# Patient Record
Sex: Female | Born: 2000 | Race: White | Hispanic: No | Marital: Single | Smoking: Never smoker
Health system: Southern US, Community
[De-identification: ages and names within clinical notes are randomized; demographics above are authoritative.]

## PROBLEM LIST (undated history)

## (undated) ENCOUNTER — Inpatient Hospital Stay (HOSPITAL_COMMUNITY): Payer: Self-pay

## (undated) DIAGNOSIS — N921 Excessive and frequent menstruation with irregular cycle: Secondary | ICD-10-CM

## (undated) DIAGNOSIS — N946 Dysmenorrhea, unspecified: Secondary | ICD-10-CM

## (undated) DIAGNOSIS — E041 Nontoxic single thyroid nodule: Secondary | ICD-10-CM

## (undated) DIAGNOSIS — Z7689 Persons encountering health services in other specified circumstances: Secondary | ICD-10-CM

## (undated) DIAGNOSIS — F419 Anxiety disorder, unspecified: Secondary | ICD-10-CM

## (undated) DIAGNOSIS — N898 Other specified noninflammatory disorders of vagina: Secondary | ICD-10-CM

## (undated) DIAGNOSIS — N63 Unspecified lump in unspecified breast: Secondary | ICD-10-CM

## (undated) DIAGNOSIS — F329 Major depressive disorder, single episode, unspecified: Secondary | ICD-10-CM

## (undated) DIAGNOSIS — N83201 Unspecified ovarian cyst, right side: Secondary | ICD-10-CM

## (undated) DIAGNOSIS — N83209 Unspecified ovarian cyst, unspecified side: Secondary | ICD-10-CM

## (undated) DIAGNOSIS — F32A Depression, unspecified: Secondary | ICD-10-CM

## (undated) HISTORY — DX: Nontoxic single thyroid nodule: E04.1

## (undated) HISTORY — DX: Other specified noninflammatory disorders of vagina: N89.8

## (undated) HISTORY — DX: Depression, unspecified: F32.A

## (undated) HISTORY — DX: Dysmenorrhea, unspecified: N94.6

## (undated) HISTORY — DX: Unspecified ovarian cyst, right side: N83.201

## (undated) HISTORY — DX: Excessive and frequent menstruation with irregular cycle: N92.1

## (undated) HISTORY — DX: Anxiety disorder, unspecified: F41.9

## (undated) HISTORY — DX: Unspecified lump in unspecified breast: N63.0

## (undated) HISTORY — DX: Unspecified ovarian cyst, unspecified side: N83.209

## (undated) HISTORY — DX: Persons encountering health services in other specified circumstances: Z76.89

---
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
## 2020-01-07 ENCOUNTER — Other Ambulatory Visit: Payer: Self-pay

## 2020-01-07 ENCOUNTER — Encounter: Payer: Self-pay | Admitting: Family Medicine

## 2020-01-07 ENCOUNTER — Ambulatory Visit (INDEPENDENT_AMBULATORY_CARE_PROVIDER_SITE_OTHER): Payer: Medicaid Other | Admitting: Family Medicine

## 2020-01-07 VITALS — BP 112/72 | Temp 98.0°F | Ht 66.0 in | Wt 149.6 lb

## 2020-01-07 DIAGNOSIS — T24201A Burn of second degree of unspecified site of right lower limb, except ankle and foot, initial encounter: Secondary | ICD-10-CM

## 2020-01-07 DIAGNOSIS — L03115 Cellulitis of right lower limb: Secondary | ICD-10-CM

## 2020-01-07 MED ORDER — MUPIROCIN 2 % EX OINT: TOPICAL_OINTMENT | CUTANEOUS | 0 refills | Status: DC

## 2020-01-07 MED ORDER — CEPHALEXIN 500 MG PO CAPS: 500.0000 mg | ORAL_CAPSULE | Freq: Three times a day (TID) | ORAL | 0 refills | Status: DC

## 2020-01-07 NOTE — Patient Instructions (Signed)
  This is some additional information regarding Covid vaccine.  The Covid vaccine-specifically Pfizer and Moderna-help program the body to help recognize Covid to lessen the chance of getting sick with Covid plus also dramatically lessen the risk of a severe outcome.  These vaccines require 2 shots spaced several weeks apart.  The vaccine utilizes mRNA technology and does not interact with our DNA.  The research for these vaccines started back in the 1990s with mRNA research and technology.  The use of mRNA technology for vaccines was actually developed back during SARS infection of [place] in 2005.  Because this virus did not go worldwide there was never a need to utilize a vaccine.  The vaccine simply instructs our immune system how to recognize Covid virus thus lessening our risk of severe illness.  The studies show that vaccines are generally very well-tolerated.  It is common to have some mild side effects such as soreness at the site of the injection, for some individuals low-grade fever body aches headaches nausea diarrhea not feeling good for 2 to 3 days after the injection.  These moderate side effects are more likely to occur with the second shot.  Within 2 weeks of completing the second shot antibody levels are at a good level. Study showed that since June 2020 90% of those hospitalized for Covid were unvaccinated.  96% of those who died of Covid were unvaccinated.  The risk of dying from Covid for those who are vaccinated and under age 50 approaches 0%.  Most people who get sick with Covid will gradually get better but there is a randomness to the illness in which even younger individuals can have a severe case and end up in the hospital with Covid.  It is true that the older we are, Covid poses more risk.  Unfortunately some of the individuals who have severe cases or die from Covid have very little underlying health issues and are not considered "elderly ".1 in 7 individuals who have  Covid continue to have some symptoms of long-term Covid.  Sometimes this can be altered smell.  There are some this can result with long-term fatigue tiredness or headaches.  Also it has been shown.  Scientific studies that Covid can trigger heart attacks and strokes.  Having a Covid vaccine will not protect one against all Covid infections.  Approximately 20 to 30% of individuals who have had vaccines may get a amount of breakthrough case.  The way I look upon this-most of us drive cars that have seatbelts and in some cases airbags.  Every day we drive, we take a risk of potentially being in the traffic accident.  Having a seatbelt and airbag does not mean that we will not get in a traffic accident.  But having a seatbelt and the airbag can dramatically lower our risk of a severe outcome that could put us in the hospital or cause us to die from a auto accident.  In the same way having the Covid vaccine will lessen our risk of getting sick with Covid but if we are unlucky enough to get Covid the chance of being hospitalized or dying from it is tremendously lower.  So just as I would not feel good about disengaging my seatbelt and disabling my airbag-I would not feel good about living in a pandemic and not having the vaccine.  It is not unusual for viruses to mutate and therefore it is not unusual to have to sometimes redesign vaccines or get   a booster in order to maintain effectiveness of the vaccine.  I have had several individuals who have stated that they are not around others and so therefore they do not need to get the vaccine.  The difficult part is-this virus can infect some without triggering symptoms-therefore we can get exposed by being around a person who looks healthy-yet they are spreading the virus.  Every person is at risk of getting Covid.  I certainly recognize that many patients have difficulty deciding whether or not to do the vaccine.  Unfortunately there is a lot of information being  pushed forward on the news media, social media such as Facebook, and discussion of all friends and family that can be confusing for the average person to figure out.  It is important to realize that this information that we are exposed to falls into 3 categories. Category #1-on target information-this is typically information that is shown by scientific studies, or stated forth by experts within their fields. Category #2-misinformation-this is information that is well intended but is not an accurate reflection of what is really the case. Category #3-dis-information-this is information that is purposefully off base.  It is designed to confuse and discourage an individual from making a proper decision.  Through the past year and a half I have heard many different reasons why people do not get Covid vaccines.  Some of these reasons are a reflection of a person's deeply held beliefs.  Other reasons are based upon misinformation propagated in social media.  When making this decision it is extremely important to weed through all the noise!   Through my years of training, it has helped me to look at public health issues and treatment options from a medical perspective.  It is my firm belief that this vaccine is safe (I personally favor Moderna but Pfizer vaccine is also good).  Please do not underestimate this virus.  It is my sincere wish that all of our patients stay healthy and do not suffer tragic outcomes from this virus.  Please consider getting the vaccine if you have not already done so.  If you should have further questions please let us know.  Thanks-Dr. Olander Friedl  
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
## 2020-01-08 ENCOUNTER — Ambulatory Visit: Payer: Self-pay | Admitting: Family Medicine

## 2020-01-29 ENCOUNTER — Encounter: Payer: Self-pay | Admitting: Family Medicine

## 2020-01-29 ENCOUNTER — Other Ambulatory Visit: Payer: Self-pay

## 2020-01-29 ENCOUNTER — Ambulatory Visit (INDEPENDENT_AMBULATORY_CARE_PROVIDER_SITE_OTHER): Payer: Medicaid Other | Admitting: Family Medicine

## 2020-01-29 VITALS — BP 122/70 | HR 98 | Temp 97.8°F | Ht 64.5 in | Wt 152.8 lb

## 2020-01-29 DIAGNOSIS — Z0000 Encounter for general adult medical examination without abnormal findings: Secondary | ICD-10-CM

## 2020-01-29 DIAGNOSIS — Z111 Encounter for screening for respiratory tuberculosis: Secondary | ICD-10-CM | POA: Diagnosis not present

## 2020-01-29 NOTE — Progress Notes (Signed)
The patient comes in today for a wellness visit.    A review of their health history was completed.  A review of medications was also completed.  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   Additional concerns: none    Patient ID: Tammy Barnett, female    DOB: 06/29/2000, 19 y.o.   MRN: [id number]   Chief Complaint  Patient presents with  . Annual Exam   Subjective:  CC: physical exam  Presents today for annual physical exam.  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  Paperwork brought with her.  She sees GYN for her oral contraceptives, does not meet criteria/guidelines for Pap smear yet.  Has no concerns for her health.  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  She has no chronic conditions managed by this office.    Medical History Tammy Barnett has a past medical history of Anxiety, Breast lump (09/10/2015), Depression, Dysmenorrhea (01/08/2014), Encounter for menstrual regulation (01/15/2014), Irregular intermenstrual bleeding (08/27/2014), Right ovarian cyst (01/15/2014), Ruptured ovarian cyst (01/08/2014), Thyroid nodule (09/10/2015), and Vaginal odor (04/16/2014).   Outpatient Encounter Medications as of 01/29/2020  Medication Sig  . acyclovir (ZOVIRAX) 400 MG tablet TAKE 1 TABLET BY MOUTH TWICE DAILY FOR PREVENTION  . Norethin Ace-Eth Estrad-FE 1-20 MG-MCG(24) CHEW CHEW AND SWALLOW 1 TABLET BY MOUTH ONCE DAILY  . [DISCONTINUED] cephALEXin (KEFLEX) 500 MG capsule Take 1 capsule (500 mg total) by mouth 3 (three) times daily.  . [DISCONTINUED] fluconazole (DIFLUCAN) 150 MG tablet Take 1 now and 1 in 3 days (Patient not taking: Reported on 01/07/2020)  . [DISCONTINUED] metroNIDAZOLE (FLAGYL) 500 MG tablet Take  1 tablet (500 mg total) by mouth 2 (two) times daily. (Patient not taking: Reported on 01/07/2020)  . [DISCONTINUED] mupirocin ointment (BACTROBAN) 2 % aply twice daily to blister   No facility-administered encounter medications on file as of 01/29/2020.     Review of Systems  Constitutional: Negative for chills and fever.  HENT: Negative for ear pain.   Respiratory: Negative for shortness of breath.   Cardiovascular: Negative for chest pain.  Gastrointestinal: Negative for abdominal pain.     Vitals BP 122/70   Pulse 98   Temp 97.8 F (36.6 C)   Ht 5' 4.5" (1.638 m)   Wt 152 lb 12.8 oz (69.3 kg)   SpO2 97%   BMI 25.82 kg/m   Objective:   Physical Exam Vitals reviewed.  Constitutional:      General: She is not in acute distress.    Appearance: Normal appearance.  HENT:     Right Ear: Tympanic membrane normal.     Left Ear: Tympanic membrane normal.     Mouth/Throat:     Mouth: Mucous membranes are moist.     Pharynx: No oropharyngeal exudate.  Eyes:     Extraocular Movements: Extraocular movements intact.     Pupils: Pupils are equal, round, and reactive to light.  Cardiovascular:     Rate and Rhythm: Normal rate and regular rhythm.     Heart sounds: Normal heart sounds.  Pulmonary:     Effort: Pulmonary effort is normal.     Breath sounds: Normal breath sounds.  Abdominal:  General: Bowel sounds are normal.     Tenderness: There is no abdominal tenderness.  Musculoskeletal:        General: Normal range of motion.  Skin:    General: Skin is warm and dry.  Neurological:     General: No focal deficit present.     Mental Status: She is alert.  Psychiatric:        Behavior: Behavior normal.      Assessment and Plan   1. Screening for tuberculosis - QuantiFERON-TB Gold Plus  2. Wellness examination   Healthy and normal exam today.  She will get the QuantiFERON-TB Gold plus to meet the work requirement for her TB skin test.  Required paperwork will  be completed once resulted.  No medication refills required, gets her oral contraceptives from GYN.  Agrees with plan of care discussed today. Understands warning signs to seek further care: Chest pain, shortness of breath, any significant change in health. Understands to follow-up in 1 year for annual exam, sooner if anything changes, or if needed.  We will notify her once TB results are available, and she can pick up her paperwork at the front office.    Chalmers Guest, NP 01/29/2020

## 2020-01-29 NOTE — Patient Instructions (Signed)
Preventive Care 21-19 Years Old, Female Preventive care refers to visits with your health care provider and lifestyle choices that can promote health and wellness. This includes:  A yearly physical exam. This may also be called an annual well check.  Regular dental visits and eye exams.  Immunizations.  Screening for certain conditions.  Healthy lifestyle choices, such as eating a healthy diet, getting regular exercise, not using drugs or products that contain nicotine and tobacco, and limiting alcohol use. What can I expect for my preventive care visit? Physical exam Your health care provider will check your:  Height and weight. This may be used to calculate body mass index (BMI), which tells if you are at a healthy weight.  Heart rate and blood pressure.  Skin for abnormal spots. Counseling Your health care provider may ask you questions about your:  Alcohol, tobacco, and drug use.  Emotional well-being.  Home and relationship well-being.  Sexual activity.  Eating habits.  Work and work environment.  Method of birth control.  Menstrual cycle.  Pregnancy history. What immunizations do I need?  Influenza (flu) vaccine  This is recommended every year. Tetanus, diphtheria, and pertussis (Tdap) vaccine  You may need a Td booster every 10 years. Varicella (chickenpox) vaccine  You may need this if you have not been vaccinated. Human papillomavirus (HPV) vaccine  If recommended by your health care provider, you may need three doses over 6 months. Measles, mumps, and rubella (MMR) vaccine  You may need at least one dose of MMR. You may also need a second dose. Meningococcal conjugate (MenACWY) vaccine  One dose is recommended if you are age 19-21 years and a first-year college student living in a residence hall, or if you have one of several medical conditions. You may also need additional booster doses. Pneumococcal conjugate (PCV13) vaccine  You may need  this if you have certain conditions and were not previously vaccinated. Pneumococcal polysaccharide (PPSV23) vaccine  You may need one or two doses if you smoke cigarettes or if you have certain conditions. Hepatitis A vaccine  You may need this if you have certain conditions or if you travel or work in places where you may be exposed to hepatitis A. Hepatitis B vaccine  You may need this if you have certain conditions or if you travel or work in places where you may be exposed to hepatitis B. Haemophilus influenzae type b (Hib) vaccine  You may need this if you have certain conditions. You may receive vaccines as individual doses or as more than one vaccine together in one shot (combination vaccines). Talk with your health care provider about the risks and benefits of combination vaccines. What tests do I need?  Blood tests  Lipid and cholesterol levels. These may be checked every 5 years starting at age 20.  Hepatitis C test.  Hepatitis B test. Screening  Diabetes screening. This is done by checking your blood sugar (glucose) after you have not eaten for a while (fasting).  Sexually transmitted disease (STD) testing.  BRCA-related cancer screening. This may be done if you have a family history of breast, ovarian, tubal, or peritoneal cancers.  Pelvic exam and Pap test. This may be done every 3 years starting at age 21. Starting at age 30, this may be done every 5 years if you have a Pap test in combination with an HPV test. Talk with your health care provider about your test results, treatment options, and if necessary, the need for more tests.   Follow these instructions at home: Eating and drinking   Eat a diet that includes fresh fruits and vegetables, whole grains, lean protein, and low-fat dairy.  Take vitamin and mineral supplements as recommended by your health care provider.  Do not drink alcohol if: ? Your health care provider tells you not to drink. ? You are  pregnant, may be pregnant, or are planning to become pregnant.  If you drink alcohol: ? Limit how much you have to 0-1 drink a day. ? Be aware of how much alcohol is in your drink. In the [place] one drink equals one 12 oz bottle of beer (355 mL), one 5 oz glass of wine (148 mL), or one 1 oz glass of hard liquor (44 mL). Lifestyle  Take daily care of your teeth and gums.  Stay active. Exercise for at least 30 minutes on 5 or more days each week.  Do not use any products that contain nicotine or tobacco, such as cigarettes, e-cigarettes, and chewing tobacco. If you need help quitting, ask your health care provider.  If you are sexually active, practice safe sex. Use a condom or other form of birth control (contraception) in order to prevent pregnancy and STIs (sexually transmitted infections). If you plan to become pregnant, see your health care provider for a preconception visit. What's next?  Visit your health care provider once a year for a well check visit.  Ask your health care provider how often you should have your eyes and teeth checked.  Stay up to date on all vaccines. This information is not intended to replace advice given to you by your health care provider. Make sure you discuss any questions you have with your health care provider. Document Revised: 10/05/2017 Document Reviewed: 10/05/2017 Elsevier Patient Education  2020 [employer].

## 2020-02-01 LAB — QUANTIFERON-TB GOLD PLUS
QuantiFERON Mitogen Value: >10 IU/mL
QuantiFERON Nil Value: 0.08 IU/mL
QuantiFERON TB1 Ag Value: 0.05 IU/mL
QuantiFERON TB2 Ag Value: 0.05 IU/mL
QuantiFERON-TB Gold Plus: NEGATIVE

## 2020-02-02 ENCOUNTER — Ambulatory Visit
Admission: EM | Admit: 2020-02-02 | Discharge: 2020-02-02 | Disposition: A | Discharge status: Home / Self Care | Payer: Medicaid Other | Attending: Family Medicine | Admitting: Family Medicine

## 2020-02-02 ENCOUNTER — Other Ambulatory Visit: Payer: Self-pay

## 2020-02-02 DIAGNOSIS — J069 Acute upper respiratory infection, unspecified: Secondary | ICD-10-CM

## 2020-02-02 DIAGNOSIS — R6889 Other general symptoms and signs: Secondary | ICD-10-CM | POA: Diagnosis not present

## 2020-02-02 DIAGNOSIS — R059 Cough, unspecified: Secondary | ICD-10-CM | POA: Diagnosis not present

## 2020-02-02 DIAGNOSIS — R000 Tachycardia, unspecified: Secondary | ICD-10-CM | POA: Diagnosis not present

## 2020-02-02 DIAGNOSIS — J029 Acute pharyngitis, unspecified: Secondary | ICD-10-CM | POA: Diagnosis not present

## 2020-02-02 LAB — POCT RAPID STREP A (OFFICE): Rapid Strep A Screen: NEGATIVE

## 2020-02-02 MED ORDER — DEXAMETHASONE SODIUM PHOSPHATE 10 MG/ML IJ SOLN
10.0000 mg | Freq: Once | INTRAMUSCULAR | Status: AC
  Administered 2020-02-02: 14:00:00 10 mg via INTRAMUSCULAR

## 2020-02-02 MED ORDER — AZITHROMYCIN 250 MG PO TABS: 250.0000 mg | ORAL_TABLET | Freq: Every day | ORAL | 0 refills | Status: DC

## 2020-02-02 NOTE — Discharge Instructions (Addendum)
I have sent in azithromycin for you to take. Take 2 tablets today, then one tablet daily for the next 4 days. As long as Covid and flu are negative.  Strep test was negative. We will culture this and let you know if you need further treatment  Your COVID and Flu tests are pending.  You should self quarantine until the test results are back.    Take Tylenol or ibuprofen as needed for fever or discomfort.  Rest and keep yourself hydrated.    Follow-up with your primary care provider if your symptoms are not improving.

## 2020-02-02 NOTE — ED Provider Notes (Signed)
[place]   [id number] 02/02/20 Arrival Time: [id number]   CC: COVID symptoms  SUBJECTIVE: History from: patient.  Tammy Barnett is a 19 y.o. female who presents with abrupt onset of nasal congestion,  And sore throat for the last 3 days. Denies sick exposure to COVID, flu or strep. Denies recent travel. Has negative history of Covid. Has not completed Covid vaccines. Has not taken OTC medications for this. Throat pain is worse when swallowing, tolerating secretions well. Denies previous symptoms in the past. Denies fever, chills, fatigue, sinus pain, rhinorrhea,  SOB, wheezing, chest pain, nausea, changes in bowel or bladder habits.    ROS: As per HPI.  All other pertinent ROS negative.     Past Medical History:  Diagnosis Date  . Anxiety   . Breast lump 09/10/2015  . Depression   . Dysmenorrhea 01/08/2014  . Encounter for menstrual regulation 01/15/2014  . Irregular intermenstrual bleeding 08/27/2014  . Right ovarian cyst 01/15/2014  . Ruptured ovarian cyst 01/08/2014  . Thyroid nodule 09/10/2015  . Vaginal odor 04/16/2014   History reviewed. No pertinent surgical history. No Known Allergies No current facility-administered medications on file prior to encounter.   Current Outpatient Medications on File Prior to Encounter  Medication Sig Dispense Refill  . acyclovir (ZOVIRAX) 400 MG tablet TAKE 1 TABLET BY MOUTH TWICE DAILY FOR PREVENTION 60 tablet 5  . Norethin Ace-Eth Estrad-FE 1-20 MG-MCG(24) CHEW CHEW AND SWALLOW 1 TABLET BY MOUTH ONCE DAILY 28 tablet 12   Social History   Socioeconomic History  . Marital status: Single    Spouse name: Not on file  . Number of children: Not on file  . Years of education: Not on file  . Highest education level: Not on file  Occupational History  . Not on file  Tobacco Use  . Smoking status: Never Smoker  . Smokeless tobacco: Never Used  Vaping Use  . Vaping Use: Never used  Substance and Sexual Activity  . Alcohol use: No   . Drug use: No  . Sexual activity: Yes    Birth control/protection: Pill  Other Topics Concern  . Not on file  Social History Narrative  . Not on file   Social Determinants of Health   Financial Resource Strain: Not on file  Food Insecurity: Not on file  Transportation Needs: Not on file  Physical Activity: Not on file  Stress: Not on file  Social Connections: Not on file  Intimate Partner Violence: Not on file   Family History  Problem Relation Age of Onset  . Hypertension Father   . Diabetes Paternal Grandmother   . Hypertension Paternal Grandfather     OBJECTIVE:  Vitals:   02/02/20 1310  BP: 129/78  Pulse: (!) 107  Resp: 18  Temp: 98.1 F (36.7 C)  TempSrc: Oral  SpO2: 99%     General appearance: alert; appears fatigued, but nontoxic; speaking in full sentences and tolerating own secretions HEENT: NCAT; Ears: EACs clear, TMs pearly gray; Eyes: PERRL.  EOM grossly intact. Sinuses: nontender; Nose: nares patent without rhinorrhea, Throat: oropharynx erythematous with petechiae, tonsils erythematous and 2+ enlarged, uvula midline  Neck: supple with LAD Lungs: unlabored respirations, symmetrical air entry; cough: absent; no respiratory distress; CTAB Heart: regular rate and rhythm.  Radial pulses 2+ symmetrical bilaterally Skin: warm and dry Psychological: alert and cooperative; normal mood and affect  LABS:  No results found for this or any previous visit (from the past 24 hour(s)).   ASSESSMENT &  PLAN:  1. Upper respiratory tract infection, unspecified type   2. Sore throat   3. Tachycardia   4. Cough     Meds ordered this encounter  Medications  . dexamethasone (DECADRON) injection 10 mg  . azithromycin (ZITHROMAX) 250 MG tablet    Sig: Take 1 tablet (250 mg total) by mouth daily. Take first 2 tablets together, then 1 every day until finished.    Dispense:  6 tablet    Refill:  0    Do not fill until 02/04/20    Order Specific Question:    Supervising Provider    Answer:   Chase Picket [[id number]]  . dexamethasone (DECADRON) injection 10 mg   Will treat for URI and tonsillitis Prescribed azithromycin Decadron 10mg  IM in office today Rapid strep is negative Will culture and inform of positive results that require further treatment  COVID and flu testing ordered.  It will take between 1-2 days for test results.  Someone will contact you regarding abnormal results.   Patient should remain in quarantine until they have received Covid results.  If negative you may resume normal activities (go back to work/school) while practicing hand hygiene, social distance, and mask wearing.  If positive, patient should remain in quarantine for 10 days from symptom onset AND greater than 72 hours after symptoms resolution (absence of fever without the use of fever-reducing medication and improvement in respiratory symptoms), whichever is longer Get plenty of rest and push fluids Use OTC zyrtec for nasal congestion, runny nose, and/or sore throat Use OTC flonase for nasal congestion and runny nose Use medications daily for symptom relief Use OTC medications like ibuprofen or tylenol as needed fever or pain Call or go to the ED if you have any new or worsening symptoms such as fever, worsening cough, shortness of breath, chest tightness, chest pain, turning blue, changes in mental status.  Reviewed expectations re: course of current medical issues. Questions answered. Outlined signs and symptoms indicating need for more acute intervention. Patient verbalized understanding. After Visit Summary given.         Faustino Congress, NP 02/04/20 [phone]

## 2020-02-02 NOTE — ED Triage Notes (Signed)
Pt presents with c/o sore throat for past few days also has nasal congestion

## 2020-02-04 LAB — COVID-19, FLU A+B NAA
Influenza A, NAA: NOT DETECTED
Influenza B, NAA: NOT DETECTED
SARS-CoV-2, NAA: NOT DETECTED

## 2020-02-04 LAB — CULTURE, GROUP A STREP (THRC)

## 2020-02-13 ENCOUNTER — Other Ambulatory Visit: Payer: Self-pay | Admitting: Adult Health

## 2020-04-13 ENCOUNTER — Other Ambulatory Visit: Payer: Self-pay

## 2020-04-13 ENCOUNTER — Encounter: Payer: Self-pay | Admitting: Emergency Medicine

## 2020-04-13 ENCOUNTER — Ambulatory Visit
Admission: EM | Admit: 2020-04-13 | Discharge: 2020-04-13 | Disposition: A | Discharge status: Home / Self Care | Payer: Medicaid Other | Attending: Emergency Medicine | Admitting: Emergency Medicine

## 2020-04-13 DIAGNOSIS — Z1152 Encounter for screening for COVID-19: Secondary | ICD-10-CM | POA: Diagnosis not present

## 2020-04-13 DIAGNOSIS — Z20822 Contact with and (suspected) exposure to covid-19: Secondary | ICD-10-CM

## 2020-04-13 DIAGNOSIS — J039 Acute tonsillitis, unspecified: Secondary | ICD-10-CM

## 2020-04-13 MED ORDER — LIDOCAINE VISCOUS HCL 2 % MT SOLN: 15.0000 mL | Freq: Four times a day (QID) | OROMUCOSAL | 0 refills | Status: DC | PRN

## 2020-04-13 MED ORDER — AMOXICILLIN-POT CLAVULANATE 875-125 MG PO TABS: 1.0000 | ORAL_TABLET | Freq: Two times a day (BID) | ORAL | 0 refills | Status: DC

## 2020-04-13 NOTE — Discharge Instructions (Addendum)
  Get plenty of rest and push fluids Augmentin was prescribed for  possible tonsillitis Viscous lidocaine prescribed.  This is an oral solution you can swish, and gargle as needed for symptomatic relief of sore throat.  Do not exceed 8 doses in a 24 hour period.  Do not use prior to eating, as this will numb your entire mouth.   Use throat lozenges such as Halls, Vicks or Cepacol to soothe throat Gargle with salty warm water daily Use medications daily for symptom relief Use OTC medications like ibuprofen or tylenol as needed fever or pain Call or go to the ED if you have any new or worsening symptoms such as fever, worsening cough, shortness of breath, chest tightness, chest pain, turning blue, changes in mental status, etc..Marland Kitchen

## 2020-04-13 NOTE — ED Triage Notes (Signed)
States she is aching all over

## 2020-04-13 NOTE — ED Provider Notes (Signed)
[place]   [id number] 04/13/20 Arrival Time: 0957   CC: COVID symptoms  SUBJECTIVE: History from: patient.  Tammy Barnett is a 20 y.o. female who presented to the urgent care for complaint of fever, sore throat that started yesterday.  Denies sick exposure to COVID, flu or strep.  Denies recent travel.  Has tried OTC NyQuil and Tylenol with mild relief.  Symptoms are made worse with swallowing.  Denies previous symptoms in the past.   Denies , fatigue, sinus pain, rhinorrhea,  SOB, wheezing, chest pain, nausea, changes in bowel or bladder habits.    ROS: As per HPI.  All other pertinent ROS negative.     Past Medical History:  Diagnosis Date  . Anxiety   . Breast lump 09/10/2015  . Depression   . Dysmenorrhea 01/08/2014  . Encounter for menstrual regulation 01/15/2014  . Irregular intermenstrual bleeding 08/27/2014  . Right ovarian cyst 01/15/2014  . Ruptured ovarian cyst 01/08/2014  . Thyroid nodule 09/10/2015  . Vaginal odor 04/16/2014   History reviewed. No pertinent surgical history. No Known Allergies No current facility-administered medications on file prior to encounter.   Current Outpatient Medications on File Prior to Encounter  Medication Sig Dispense Refill  . acyclovir (ZOVIRAX) 400 MG tablet TAKE 1 TABLET BY MOUTH TWICE DAILY FOR PREVENTION 60 tablet 5  . azithromycin (ZITHROMAX) 250 MG tablet Take 1 tablet (250 mg total) by mouth daily. Take first 2 tablets together, then 1 every day until finished. 6 tablet 0  . Norethin Ace-Eth Estrad-FE 1-20 MG-MCG(24) CHEW CHEW AND SWALLOW 1 TABLET BY MOUTH ONCE DAILY 28 tablet 12   Social History   Socioeconomic History  . Marital status: Single    Spouse name: Not on file  . Number of children: Not on file  . Years of education: Not on file  . Highest education level: Not on file  Occupational History  . Not on file  Tobacco Use  . Smoking status: Never Smoker  . Smokeless tobacco: Never Used  Vaping Use   . Vaping Use: Every day  Substance and Sexual Activity  . Alcohol use: No  . Drug use: No  . Sexual activity: Yes    Birth control/protection: Pill  Other Topics Concern  . Not on file  Social History Narrative  . Not on file   Social Determinants of Health   Financial Resource Strain: Not on file  Food Insecurity: Not on file  Transportation Needs: Not on file  Physical Activity: Not on file  Stress: Not on file  Social Connections: Not on file  Intimate Partner Violence: Not on file   Family History  Problem Relation Age of Onset  . Hypertension Father   . Diabetes Paternal Grandmother   . Hypertension Paternal Grandfather     OBJECTIVE:  Vitals:   04/13/20 1027  BP: 121/75  Pulse: 97  Resp: 18  Temp: 99.2 F (37.3 C)  TempSrc: Oral  SpO2: 95%     General appearance: alert; appears fatigued, but nontoxic; speaking in full sentences and tolerating own secretions HEENT: NCAT; Ears: EACs clear, TMs pearly gray; Eyes: PERRL.  EOM grossly intact. Sinuses: nontender; Nose: nares patent without rhinorrhea, Throat: oropharynx clear, tonsils  2+ erythematous , no exudate, uvula midline  Neck: supple without LAD Lungs: unlabored respirations, symmetrical air entry; cough: absent; no respiratory distress; CTAB Heart: regular rate and rhythm.  Radial pulses 2+ symmetrical bilaterally Skin: warm and dry Psychological: alert and cooperative; normal mood  and affect  LABS:  No results found for this or any previous visit (from the past 24 hour(s)).   ASSESSMENT & PLAN:  1. Acute tonsillitis, unspecified etiology   2. Exposure to COVID-19 virus   3. Encounter for screening for COVID-19     Meds ordered this encounter  Medications  . lidocaine (XYLOCAINE) 2 % solution    Sig: Use as directed 15 mLs in the mouth or throat every 6 (six) hours as needed for mouth pain.    Dispense:  100 mL    Refill:  0  . amoxicillin-clavulanate (AUGMENTIN) 875-125 MG tablet    Sig:  Take 1 tablet by mouth every 12 (twelve) hours.    Dispense:  14 tablet    Refill:  0    Discharge instructions  Get plenty of rest and push fluids Augmentin was prescribed for  possible tonsillitis Viscous lidocaine prescribed.  This is an oral solution you can swish, and gargle as needed for symptomatic relief of sore throat.  Do not exceed 8 doses in a 24 hour period.  Do not use prior to eating, as this will numb your entire mouth.   Use throat lozenges such as Halls, Vicks or Cepacol to soothe throat Gargle with salty warm water daily Use medications daily for symptom relief Use OTC medications like ibuprofen or tylenol as needed fever or pain Call or go to the ED if you have any new or worsening symptoms such as fever, worsening cough, shortness of breath, chest tightness, chest pain, turning blue, changes in mental status, etc...   Reviewed expectations re: course of current medical issues. Questions answered. Outlined signs and symptoms indicating need for more acute intervention. Patient verbalized understanding. After Visit Summary given.         Emerson Monte, FNP 04/13/20 1117

## 2020-04-13 NOTE — ED Triage Notes (Signed)
Sore throat since yesterday.  Throat started hurting after she used a diffiernt type of vape.

## 2020-04-14 ENCOUNTER — Telehealth: Payer: Self-pay | Admitting: Emergency Medicine

## 2020-04-14 LAB — COVID-19, FLU A+B NAA
Influenza A, NAA: NOT DETECTED
Influenza B, NAA: NOT DETECTED
SARS-CoV-2, NAA: NOT DETECTED

## 2020-04-14 MED ORDER — ONDANSETRON 4 MG PO TBDP: 4.0000 mg | ORAL_TABLET | Freq: Three times a day (TID) | ORAL | 0 refills | Status: DC | PRN

## 2020-04-15 ENCOUNTER — Encounter: Payer: Self-pay | Admitting: Family Medicine

## 2020-04-15 ENCOUNTER — Ambulatory Visit (INDEPENDENT_AMBULATORY_CARE_PROVIDER_SITE_OTHER): Payer: Medicaid Other | Admitting: Family Medicine

## 2020-04-15 ENCOUNTER — Other Ambulatory Visit: Payer: Self-pay

## 2020-04-15 VITALS — HR 89 | Temp 97.0°F | Ht 64.5 in | Wt 152.0 lb

## 2020-04-15 DIAGNOSIS — B085 Enteroviral vesicular pharyngitis: Secondary | ICD-10-CM | POA: Diagnosis not present

## 2020-04-15 NOTE — Patient Instructions (Signed)
Hand, Foot, and Mouth Disease, Adult Hand, foot, and mouth disease is a common viral illness. It happens mainly in children who are younger than 5 years, but adolescents and adults can also get it. The illness can spread easily from person to person (is contagious) and often causes:  Sores in the mouth.  A rash on the hands and feet. Usually, this condition is not serious. Most people get better within 1-2 weeks. What are the causes? This illness is usually caused by a group of viruses called enteroviruses. A person is most contagious during the first week of the illness. The infection spreads through direct contact with:  Discharge from the nose or throat of an infected person.  Stool (feces) of an infected person.  Surfaces that have been contaminated. What are the signs or symptoms? Symptoms of this condition include:  Small sores in the mouth. These may cause pain.  A rash on the hands and feet and sometimes on the buttocks. The rash may also occur on the arms, legs, or other areas of the body. The rash may look like small red bumps or sores and may have blisters.  Fever.  Sore throat.  Body aches or headaches.  Decreased appetite. How is this diagnosed? This condition is usually diagnosed based on:  A physical exam. Your health care provider will look at your rash and mouth sores.  In some cases, a stool sample or a throat swab may be taken to check for the virus or for other infections. How is this treated? In most cases, no treatment is needed. People usually get better within 2 weeks. To help relieve pain or fever, your health care provider may recommend over-the-counter medicines such as ibuprofen or acetaminophen. To help relieve discomfort from mouth sores, your health care provider may recommend using:  Solutions that are rinsed in the mouth.  Pain-relieving gel that is applied to the sores (topical gel).  Antacid medicine. Follow these instructions at  home: Managing pain and discomfort  Rinse your mouth with a mixture of salt and water 3-4 times a day or as needed. To make salt water, completely dissolve -1 tsp (3-6 g) of salt in 1 cup (237 mL) of warm water. This can help to reduce pain from the mouth sores.  To relieve discomfort when you are eating: ? Try combinations of foods to see what you can tolerate. Aim for a balanced diet. ? Eat soft foods. These may be easier to swallow. ? Avoid foods and drinks that are salty, spicy, or acidic. ? Avoid alcohol. ? Try cold food and drinks, such as water, milk, milkshakes, frozen ice pops, slushies, and sherbets. Low-calorie sports drinks are a good choice for staying hydrated.   Relieving pain, itching, and discomfort in rash areas  Keep cool and out of the sun. Sweating and feeling hot can make itching worse.  Cool baths can be soothing. Add baking soda or dry oatmeal to the water to reduce itching. Do not bathe in hot water.  Put cold, wet cloths (cold compresses) on itchy areas, as told by your health care provider.  Use calamine lotion as recommended by your health care provider. This is an over-the-counter lotion that helps to relieve itchiness.  Make sure you do not scratch or pick at the rash. To help prevent scratching: ? Keep your fingernails clean and cut short. ? Wear soft gloves or mittens while sleeping if scratching is a problem. General instructions  Take or apply over-the-counter and prescription  medicines only as told by your health care provider.  Wash your hands often with soap and water for at least 20 seconds. If soap and water are not available, use an alcohol-based hand sanitizer.  Clean and disinfect surfaces and shared items that you frequently touch.  Stay away from work, schools, or other group settings during the first few days of the illness, or until your fever is gone for at least 24 hours.  Return to your normal activities as told by your health care  provider. Ask your health care provider what activities are safe for you.  Keep all follow-up visits. This is important.   Contact a health care provider if:  Your symptoms get worse or do not improve within 2 weeks.  You have pain that does not get better with medicine.  You have trouble swallowing.  You develop sores or blisters on your lips or outside of your mouth.  You have a fever for more than 3 days. Get help right away if:  You develop signs of severe dehydration, such as: ? Decreased urination. This means urinating only very small amounts or fewer than 3 times in a 24-hour period. ? Urine that is very dark. ? Dry mouth, tongue, or lips. ? Decreased tears or sunken eyes. ? Dry skin. ? Rapid breathing. ? Decreased activity or being very sleepy. ? Pale skin. ? Your fingertips take longer than 2 seconds to turn pink after a gentle squeeze. ? Weight loss.  You have a severe headache.  You have a stiff neck.  You have changes in your behavior.  You have chest pain or trouble breathing. These symptoms may represent a serious problem that is an emergency. Do not wait to see if the symptoms will go away. Get medical help right away. Call your local emergency services (911 in the [place] Do not drive yourself to the hospital. Summary  Hand, foot, and mouth disease is a common viral illness.  This disease can spread easily from person to person (is contagious).  The illness often causes sores in the mouth, a rash on the hands and feet, a fever, and a sore throat.  Typically, no treatment is needed for this condition. People usually get better within 2 weeks.  Get help right away if you develop signs of severe dehydration. This information is not intended to replace advice given to you by your health care provider. Make sure you discuss any questions you have with your health care provider. Document Revised: 10/28/2019 Document Reviewed: 10/28/2019 Elsevier Patient  Education  [employer].

## 2020-04-15 NOTE — Progress Notes (Signed)
Patient ID: Tammy Barnett, female    DOB: [birth year], 20 y.o.   MRN: [id number]   Chief Complaint  Patient presents with  . Sore Throat   Subjective:    HPI  Pt seen for worsening sore throat.   3 days ago had fever, sore throat, body aches, headache.  Went to urgent care and prescribed augmentin, zofran and lidocaine solution. Feeling worse today. Pt states throat is worse. States covid and flu test were negative at urgent care. Headache/bodyaches has all resolved.   Pt started feeling bad with sore throat 3 days ago.  Then later that day worsening pain and neck pain. Had achiness and body aches.  Went to bed.  Chills that night. Low grade temp 100.1 a couple days ago.   covid and flu test-negative.  Didn't test for strep. Gave augmentin, lidocaine solution and zofran. Pt not liking to use lidocaine due to taste.    Has h/o cold sores.  Taking acyclovir daily 400mg  bid.   Medical History Tam[facility] has a past medical history of Anxiety, Breast lump (09/10/2015), Depression, Dysmenorrhea (01/08/2014), Encounter for menstrual regulation (01/15/2014), Irregular intermenstrual bleeding (08/27/2014), Right ovarian cyst (01/15/2014), Ruptured ovarian cyst (01/08/2014), Thyroid nodule (09/10/2015), and Vaginal odor (04/16/2014).   Outpatient Encounter Medications as of 04/15/2020  Medication Sig  . acyclovir (ZOVIRAX) 400 MG tablet TAKE 1 TABLET BY MOUTH TWICE DAILY FOR PREVENTION  . amoxicillin-clavulanate (AUGMENTIN) 875-125 MG tablet Take 1 tablet by mouth every 12 (twelve) hours.  . lidocaine (XYLOCAINE) 2 % solution Use as directed 15 mLs in the mouth or throat every 6 (six) hours as needed for mouth pain.  . Norethin Ace-Eth Estrad-FE 1-20 MG-MCG(24) CHEW CHEW AND SWALLOW 1 TABLET BY MOUTH ONCE DAILY  . ondansetron (ZOFRAN ODT) 4 MG disintegrating tablet Take 1 tablet (4 mg total) by mouth every 8 (eight) hours as needed for nausea or vomiting.  . [DISCONTINUED] azithromycin (ZITHROMAX)  250 MG tablet Take 1 tablet (250 mg total) by mouth daily. Take first 2 tablets together, then 1 every day until finished.   No facility-administered encounter medications on file as of 04/15/2020.     Review of Systems  Constitutional: Positive for chills (resolved). Negative for fever.  HENT: Positive for sore throat. Negative for congestion and rhinorrhea.   Respiratory: Negative for cough, shortness of breath and wheezing.   Cardiovascular: Negative for chest pain and leg swelling.  Gastrointestinal: Negative for abdominal pain, diarrhea, nausea and vomiting.  Genitourinary: Negative for dysuria and frequency.  Musculoskeletal: Positive for myalgias (resolved). Negative for arthralgias and back pain.  Skin: Negative for rash.  Neurological: Negative for dizziness, weakness and headaches.     Vitals Pulse 89   Temp (!) 97 F (36.1 C)   Ht 5' 4.5" (1.638 m)   Wt 152 lb (68.9 kg)   SpO2 99%   BMI 25.69 kg/m   Objective:   Physical Exam Vitals and nursing note reviewed.  Constitutional:      General: She is not in acute distress.    Appearance: Normal appearance. She is well-developed. She is ill-appearing.  HENT:     Head: Normocephalic and atraumatic.     Nose: Nose normal. No congestion or rhinorrhea.     Mouth/Throat:     Mouth: Mucous membranes are moist. Oral lesions (rt soft palate ulceration) present.     Pharynx: Posterior oropharyngeal erythema (bilateral soft palaate and tonsils) present. No oropharyngeal exudate.     Tonsils: No tonsillar exudate  or tonsillar abscesses.     Comments: +ulcer on left upper lip  Eyes:     Extraocular Movements: Extraocular movements intact.     Conjunctiva/sclera: Conjunctivae normal.     Pupils: Pupils are equal, round, and reactive to light.  Cardiovascular:     Rate and Rhythm: Normal rate and regular rhythm.     Pulses: Normal pulses.     Heart sounds: Normal heart sounds.  Pulmonary:     Effort: Pulmonary effort is  normal.     Breath sounds: Normal breath sounds. No wheezing, rhonchi or rales.  Musculoskeletal:        General: Normal range of motion.     Right lower leg: No edema.     Left lower leg: No edema.  Skin:    General: Skin is warm and dry.     Findings: No lesion or rash.  Neurological:     General: No focal deficit present.     Mental Status: She is alert and oriented to person, place, and time.  Psychiatric:        Mood and Affect: Mood normal.        Behavior: Behavior normal.      Assessment and Plan   1. 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  Reviewed course of virus with pt that this is caused by virus and won't get better with abx. and use lidocaine mouth gargle. And take tylenol and ibuprofen. Increase fluids.  Pt to call or rto if not improving.   Return if symptoms worsen or fail to improve.  04/15/2020

## 2020-04-17 ENCOUNTER — Telehealth: Payer: Self-pay | Admitting: Family Medicine

## 2020-04-17 NOTE — Telephone Encounter (Signed)
Please advise. Thank you

## 2020-04-17 NOTE — Telephone Encounter (Signed)
Is Patient hydrating and drinking water? Is she taking any ibuprofen or Tylenol for the pain? Also patient was given lidocaine from urgent care is she using those? She's already on antibiotics so that would cover any strep infection in her throat. I will continue to do the things above and salt water gargles. And if she just has much pain that she needs to come in today for an exam. Thanks Dr. Lovena Le

## 2020-04-17 NOTE — Telephone Encounter (Signed)
No bc likely this whole process is a viral syndrome and not bacterial .  But since she was given the medication from urgent care, I told her to just finish it.  But likely viral and needs to run course abut 7-10 days.   Dr. Lovena Le

## 2020-04-17 NOTE — Telephone Encounter (Signed)
Patient was seen 3/9 and diagnosis with hand/foot/ mouth. She woke up this morning coughing up green/browish mucus with a sore throat with blisters in back. Please advise [facility]

## 2020-04-17 NOTE — Telephone Encounter (Signed)
Patient states she is doing all the things advised but she is coughing up green mucus now and didn't know if she needed a different antibiotic.

## 2020-04-20 NOTE — Telephone Encounter (Signed)
Patient notified and verbalized understanding- will call back if any further problems.

## 2020-05-10 ENCOUNTER — Other Ambulatory Visit: Payer: Self-pay

## 2020-05-10 ENCOUNTER — Encounter (HOSPITAL_COMMUNITY): Payer: Self-pay | Admitting: Emergency Medicine

## 2020-05-10 ENCOUNTER — Emergency Department (HOSPITAL_COMMUNITY)
Admission: EM | Admit: 2020-05-10 | Discharge: 2020-05-10 | Disposition: A | Discharge status: Home / Self Care | Payer: Medicaid Other | Attending: Emergency Medicine | Admitting: Emergency Medicine

## 2020-05-10 DIAGNOSIS — R42 Dizziness and giddiness: Secondary | ICD-10-CM | POA: Diagnosis not present

## 2020-05-10 DIAGNOSIS — R55 Syncope and collapse: Secondary | ICD-10-CM | POA: Diagnosis not present

## 2020-05-10 LAB — CBC WITH DIFFERENTIAL/PLATELET
Abs Immature Granulocytes: 0.02 10*3/uL (ref 0.00–0.07)
Basophils Absolute: 0.1 10*3/uL (ref 0.0–0.1)
Basophils Relative: 1 %
Eosinophils Absolute: 0 10*3/uL (ref 0.0–0.5)
Eosinophils Relative: 0 %
HCT: 40.6 % (ref 36.0–46.0)
Hemoglobin: 13.4 g/dL (ref 12.0–15.0)
Immature Granulocytes: 0 %
Lymphocytes Relative: 13 %
Lymphs Abs: 1.2 10*3/uL (ref 0.7–4.0)
MCH: 29.9 pg (ref 26.0–34.0)
MCHC: 33 g/dL (ref 30.0–36.0)
MCV: 90.6 fL (ref 80.0–100.0)
Monocytes Absolute: 0.5 10*3/uL (ref 0.1–1.0)
Monocytes Relative: 5 %
Neutro Abs: 7.6 10*3/uL (ref 1.7–7.7)
Neutrophils Relative %: 81 %
Platelets: 283 10*3/uL (ref 150–400)
RBC: 4.48 MIL/uL (ref 3.87–5.11)
RDW: 13.2 % (ref 11.5–15.5)
WBC: 9.3 10*3/uL (ref 4.0–10.5)
nRBC: 0 % (ref 0.0–0.2)

## 2020-05-10 LAB — BASIC METABOLIC PANEL
Anion gap: 10 (ref 5–15)
BUN: 9 mg/dL (ref 6–20)
CO2: 24 mmol/L (ref 22–32)
Calcium: 9.2 mg/dL (ref 8.9–10.3)
Chloride: 103 mmol/L (ref 98–111)
Creatinine, Ser: 0.52 mg/dL (ref 0.44–1.00)
GFR, Estimated: >60 mL/min (ref >60)
Glucose, Bld: 86 mg/dL (ref 70–99)
Potassium: 3.7 mmol/L (ref 3.5–5.1)
Sodium: 137 mmol/L (ref 135–145)

## 2020-05-10 LAB — URINALYSIS, ROUTINE W REFLEX MICROSCOPIC
Bacteria, UA: NONE SEEN
Bilirubin Urine: NEGATIVE
Glucose, UA: NEGATIVE mg/dL
Hgb urine dipstick: NEGATIVE
Ketones, ur: 80 mg/dL — AB
Leukocytes,Ua: NEGATIVE
Nitrite: NEGATIVE
Protein, ur: 100 mg/dL — AB
Specific Gravity, Urine: 1.029 (ref 1.005–1.030)
pH: 6 (ref 5.0–8.0)

## 2020-05-10 LAB — HCG, QUANTITATIVE, PREGNANCY: hCG, Beta Chain, Quant, S: <1 m[IU]/mL (ref <5)

## 2020-05-10 MED ORDER — SODIUM CHLORIDE 0.9 % IV BOLUS
1000.0000 mL | Freq: Once | INTRAVENOUS | Status: AC
  Administered 2020-05-10: 1000 mL via INTRAVENOUS

## 2020-05-10 MED ORDER — ONDANSETRON 4 MG PO TBDP: 4.0000 mg | ORAL_TABLET | Freq: Once | ORAL | Status: DC

## 2020-05-10 NOTE — ED Triage Notes (Signed)
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

## 2020-05-10 NOTE — Discharge Instructions (Addendum)
Your exam and labs, including your ekg are reassuring today, but you had lots of ketones in your urine suggesting some degree of dehydration.  Rest and make sure you are drinking plenty of fluids.  Use caution with your alcohol intake.  See your MD this week for a recheck of your symptoms.

## 2020-05-10 NOTE — ED Notes (Signed)
Pt given crackers and a sprite per PA approval at this time.

## 2020-05-10 NOTE — ED Provider Notes (Signed)
[facility] Provider Note   CSN: [id number] Arrival date & time: 05/10/20  1321     History Chief Complaint  Patient presents with  . Dizziness    Tammy Barnett is a 20 y.o. female with no significant chronic medical conditions presenting for evaluation of lightheadedness and near syncope which occurred just prior to arrival.  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  She did not fall, she was helped to a chair by coworker.  She reports her arms became stiff and for a brief time she was unable to move her arms immediately after this event.  She denies LOC.  She denies chest pain or palpitations, headache, shortness of breath during or since this episode.  She states it is possible she is dehydrated, she drank alcohol last night, although states it was not excessive.  She had no p.o. intake today, states she typically does not eat until later in the day.  She currently feels well although endorses generalized fatigue.  HPI     Past Medical History:  Diagnosis Date  . Anxiety   . Breast lump 09/10/2015  . Depression   . Dysmenorrhea 01/08/2014  . Encounter for menstrual regulation 01/15/2014  . Irregular intermenstrual bleeding 08/27/2014  . Right ovarian cyst 01/15/2014  . Ruptured ovarian cyst 01/08/2014  . Thyroid nodule 09/10/2015  . Vaginal odor 04/16/2014    Patient Active Problem List   Diagnosis Date Noted  . Screening for tuberculosis 01/29/2020  . Dyspareunia, female 10/16/2019  . Vaginal discharge 10/16/2019  . Burning with urination 10/16/2019  . Anxiety 04/11/2017  . Breast lump 09/10/2015  . Thyroid nodule 09/10/2015  . Fever blister 10/22/2014  . Breakthrough bleeding on birth control pills 08/27/2014  . Wellness examination 01/15/2014  . Right ovarian cyst 01/15/2014  . Ruptured ovarian  cyst 01/08/2014  . Dysmenorrhea 01/08/2014  . Osgood-Schlatter's disease 10/14/2013    History reviewed. No pertinent surgical history.   OB History    Gravida  0   Para  0   Term  0   Preterm  0   AB  0   Living  0     SAB  0   IAB  0   Ectopic  0   Multiple  0   Live Births              Family History  Problem Relation Age of Onset  . Hypertension Father   . Diabetes Paternal Grandmother   . Hypertension Paternal Grandfather     Social History   Tobacco Use  . Smoking status: Never Smoker  . Smokeless tobacco: Never Used  Vaping Use  . Vaping Use: Every day  Substance Use Topics  . Alcohol use: No  . Drug use: No    Home Medications Prior to Admission medications   Medication Sig Start Date End Date Taking? Authorizing Provider  acyclovir (ZOVIRAX) 400 MG tablet TAKE 1 TABLET BY MOUTH TWICE DAILY FOR PREVENTION Patient taking differently: Take 400 mg by mouth 2 (two) times daily. For prevention 05/03/19  Yes Luking, Elayne Snare, MD  Norethin Ace-Eth Estrad-FE 1-20 MG-MCG(24) CHEW CHEW AND SWALLOW 1 TABLET BY MOUTH ONCE DAILY Patient taking differently: Chew 1 tablet by mouth daily. 02/13/20  Yes Derrek Monaco A, NP  ondansetron (ZOFRAN ODT) 4 MG disintegrating tablet  Take 1 tablet (4 mg total) by mouth every 8 (eight) hours as needed for nausea or vomiting. 04/14/20  Yes Avegno, Darrelyn Hillock, FNP  amoxicillin-clavulanate (AUGMENTIN) 875-125 MG tablet Take 1 tablet by mouth every 12 (twelve) hours. Patient not taking: Reported on 05/10/2020 04/13/20   Emerson Monte, FNP  lidocaine (XYLOCAINE) 2 % solution Use as directed 15 mLs in the mouth or throat every 6 (six) hours as needed for mouth pain. Patient not taking: Reported on 05/10/2020 04/13/20   Emerson Monte, FNP    Allergies    Patient has no known allergies.  Review of Systems   Review of Systems  Constitutional: Positive for diaphoresis and fatigue. Negative for chills and fever.  HENT:  Negative for congestion and sore throat.   Eyes: Positive for visual disturbance.  Respiratory: Negative for chest tightness and shortness of breath.   Cardiovascular: Negative for chest pain.  Gastrointestinal: Positive for nausea. Negative for abdominal pain and vomiting.  Genitourinary: Negative.   Musculoskeletal: Negative for arthralgias, joint swelling and neck pain.  Skin: Negative.  Negative for rash and wound.  Neurological: Positive for light-headedness. Negative for dizziness, weakness, numbness and headaches.       Negative except as mentioned in HPI.   Psychiatric/Behavioral: Negative.   All other systems reviewed and are negative.   Physical Exam Updated Vital Signs BP 113/72   Pulse 89   Temp 98.8 F (37.1 C) (Oral)   Resp 18   Ht 5\' 7"  (1.702 m)   Wt 68 kg   SpO2 100%   BMI 23.49 kg/m   Physical Exam Vitals and nursing note reviewed.  Constitutional:      Appearance: She is well-developed.  HENT:     Head: Normocephalic and atraumatic.     Mouth/Throat:     Mouth: Mucous membranes are dry.  Eyes:     Conjunctiva/sclera: Conjunctivae normal.  Cardiovascular:     Rate and Rhythm: Normal rate and regular rhythm.     Heart sounds: Normal heart sounds.  Pulmonary:     Effort: Pulmonary effort is normal.     Breath sounds: Normal breath sounds. No wheezing.  Abdominal:     General: Bowel sounds are normal.     Palpations: Abdomen is soft.     Tenderness: There is no abdominal tenderness.  Musculoskeletal:        General: Normal range of motion.     Cervical back: Normal range of motion.  Skin:    General: Skin is warm and dry.  Neurological:     General: No focal deficit present.     Mental Status: She is alert.     ED Results / Procedures / Treatments   Labs (all labs ordered are listed, but only abnormal results are displayed) Labs Reviewed  URINALYSIS, ROUTINE W REFLEX MICROSCOPIC - Abnormal; Notable for the following components:       Result Value   APPearance HAZY (*)    Ketones, ur 80 (*)    Protein, ur 100 (*)    All other components within normal limits  CBC WITH DIFFERENTIAL/PLATELET  BASIC METABOLIC PANEL  HCG, QUANTITATIVE, PREGNANCY    EKG ED ECG REPORT   Date: 05/10/2020  Rate: 90  Rhythm: normal sinus rhythm  QRS Axis: normal  Intervals: normal  ST/T Wave abnormalities: normal  Conduction Disutrbances:none  Narrative Interpretation:   Old EKG Reviewed: unchanged  I have personally reviewed the EKG tracing and agree with the computerized printout  as noted.   Radiology No results found.  Procedures Procedures   Medications Ordered in ED Medications  ondansetron (ZOFRAN-ODT) disintegrating tablet 4 mg (4 mg Oral Patient Refused/Not Given 05/10/20 1524)  sodium chloride 0.9 % bolus 1,000 mL (has no administration in time range)  sodium chloride 0.9 % bolus 1,000 mL (1,000 mLs Intravenous New Bag/Given 05/10/20 1524)    ED Course  I have reviewed the triage vital signs and the nursing notes.  Pertinent labs & imaging results that were available during my care of the patient were reviewed by me and considered in my medical decision making (see chart for details).    MDM Rules/Calculators/A&P                          Pt with suspected near syncopal event, probably vasovagal.  She does have increased ketones in her urine, so possibly at least some dehydration, creatinine normal.  IV fluids given,  Orthostatic VS obtained and normal.  She had no further episodes in the ed, monitored with no ectopy.  Will plan close office f/u with pcp this week.  Final Clinical Impression(s) / ED Diagnoses Final diagnoses:  Near syncope    Rx / DC Orders ED Discharge Orders    None       Landis Martins 05/10/20 1832    Noemi Chapel, MD 05/11/20 [phone]

## 2020-05-11 ENCOUNTER — Telehealth: Payer: Self-pay | Admitting: *Deleted

## 2020-05-11 NOTE — Telephone Encounter (Signed)
Transition Care Management Unsuccessful Follow-up Telephone Call  Date of discharge and from where:  05/10/2020 [facility]  Attempts:  1st Attempt  Reason for unsuccessful TCM follow-up call:  Left voice message

## 2020-05-12 NOTE — Telephone Encounter (Signed)
Transition Care Management Unsuccessful Follow-up Telephone Call  Date of discharge and from where:  05/10/2020 - [facility]  Attempts:  2nd Attempt  Reason for unsuccessful TCM follow-up call:  Left voice message

## 2020-05-13 ENCOUNTER — Encounter: Payer: Self-pay | Admitting: Family Medicine

## 2020-05-13 ENCOUNTER — Other Ambulatory Visit: Payer: Self-pay

## 2020-05-13 ENCOUNTER — Ambulatory Visit (INDEPENDENT_AMBULATORY_CARE_PROVIDER_SITE_OTHER): Payer: Medicaid Other | Admitting: Family Medicine

## 2020-05-13 VITALS — BP 118/70 | HR 81 | Temp 97.5°F | Wt 152.0 lb

## 2020-05-13 DIAGNOSIS — E611 Iron deficiency: Secondary | ICD-10-CM | POA: Diagnosis not present

## 2020-05-13 DIAGNOSIS — R55 Syncope and collapse: Secondary | ICD-10-CM | POA: Diagnosis not present

## 2020-05-13 DIAGNOSIS — R42 Dizziness and giddiness: Secondary | ICD-10-CM

## 2020-05-13 DIAGNOSIS — R5383 Other fatigue: Secondary | ICD-10-CM | POA: Diagnosis not present

## 2020-05-13 MED ORDER — FAMOTIDINE 40 MG PO TABS: ORAL_TABLET | ORAL | 1 refills | Status: DC

## 2020-05-13 NOTE — Progress Notes (Signed)
   Subjective:    Patient ID: Tammy Barn[facility], female    DOB: [birth year], 20 y.o.   MRN: [id number]  HPI Pt went to ER at [facility] on 05/10/20 for dizziness. Pt states that she became dizzy at work; she became hot all of a sudden, room began spinning, hand/arm numbness, speech began to slur and hearing went out. Pt states when she eats she begins to feel nauseous and shaky.  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 Negative for activity change and appetite change.  HENT: Negative for congestion and rhinorrhea.   Respiratory: Negative for cough and shortness of breath.   Cardiovascular: Negative for chest pain and leg swelling.  Gastrointestinal: Negative for abdominal pain, nausea and vomiting.  Skin: Negative for color change.  Neurological: Positive for dizziness. Negative for weakness.  Psychiatric/Behavioral: Negative for agitation and confusion.       Objective:   Physical Exam Vitals reviewed.  Constitutional:      General: She is not in acute distress. HENT:     Head: Normocephalic and atraumatic.  Eyes:     General:        Right eye: No discharge.        Left eye: No discharge.  Neck:     Trachea: No tracheal deviation.  Cardiovascular:     Rate and Rhythm: Normal rate and regular rhythm.     Heart sounds: Normal heart sounds. No murmur heard.   Pulmonary:     Effort: Pulmonary effort is normal. No respiratory distress.     Breath sounds: Normal breath sounds.  Lymphadenopathy:     Cervical: No cervical adenopathy.  Skin:    General: Skin is warm and dry.   Neurological:     Mental Status: She is alert.     Coordination: Coordination normal.  Psychiatric:        Behavior: Behavior normal.   Abdomen is soft no guarding rebound or tenderness        Assessment & Plan:  Vasovagal syncope Mild dehydration based upon urinalysis done in the ER Corrected after being given fluids Improve nutrition was discussed in detail No further work-up of her heart necessary at this time Patient will give [place] update in few weeks how she is doing Other fatigue - Plan: Basic Metabolic Panel (BMET), TSH, T4, free, Ferritin, Iron Binding Cap (TIBC)([facility]), Magnesium  Iron deficiency - Plan: Basic Metabolic Panel (BMET), TSH, T4, free, Ferritin, Iron Binding Cap (TIBC)([facility]), Magnesium  Postural dizziness with presyncope - Plan: Basic Metabolic Panel (BMET), TSH, T4, free, Ferritin, Iron Binding Cap (TIBC)([facility]), Magnesium med Additional lab work was ordered because of iron deficient anemia fatigue and tiredness

## 2020-05-13 NOTE — Patient Instructions (Signed)
Hi Stacye  I would recommend that you utilize famotidine once daily over the course of the next few weeks to see if that helps with your nausea.  In addition to this continue to eat healthy and stay well-hydrated.  Please send [place] an update in approximately 3 weeks on how you are doing but feel free to reach out sooner if you are having any problems.  Thanks-Dr. Nicki Reaper

## 2020-05-13 NOTE — Telephone Encounter (Signed)
Transition Care Management Unsuccessful Follow-up Telephone Call  Date of discharge and from where:  05/10/2020 [facility]  Attempts:  3rd Attempt  Reason for unsuccessful TCM follow-up call:  Left voice message

## 2020-06-04 ENCOUNTER — Other Ambulatory Visit: Payer: Self-pay | Admitting: Family Medicine

## 2020-06-11 ENCOUNTER — Encounter: Payer: Self-pay | Admitting: Family Medicine

## 2020-06-11 ENCOUNTER — Other Ambulatory Visit: Payer: Self-pay

## 2020-06-11 ENCOUNTER — Ambulatory Visit (INDEPENDENT_AMBULATORY_CARE_PROVIDER_SITE_OTHER): Payer: Medicaid Other | Admitting: Family Medicine

## 2020-06-11 VITALS — HR 105 | Temp 97.2°F | Ht 67.0 in | Wt 146.0 lb

## 2020-06-11 DIAGNOSIS — H66001 Acute suppurative otitis media without spontaneous rupture of ear drum, right ear: Secondary | ICD-10-CM | POA: Diagnosis not present

## 2020-06-11 DIAGNOSIS — J029 Acute pharyngitis, unspecified: Secondary | ICD-10-CM | POA: Diagnosis not present

## 2020-06-11 LAB — POCT RAPID STREP A (OFFICE): Rapid Strep A Screen: NEGATIVE

## 2020-06-11 MED ORDER — AMOXICILLIN 500 MG PO CAPS: 500.0000 mg | ORAL_CAPSULE | Freq: Two times a day (BID) | ORAL | 0 refills | Status: AC

## 2020-06-11 NOTE — Patient Instructions (Signed)
Otitis Media, Adult  Otitis media is a condition in which the middle ear is red and swollen (inflamed) and full of fluid. The middle ear is the part of the ear that contains bones for hearing as well as air that helps send sounds to the brain. The condition usually goes away on its own. What are the causes? This condition is caused by a blockage in the eustachian tube. The eustachian tube connects the middle ear to the back of the nose. It normally allows air into the middle ear. The blockage is caused by fluid or swelling. Problems that can cause blockage include:  A cold or infection that affects the nose, mouth, or throat.  Allergies.  An irritant, such as tobacco smoke.  Adenoids that have become large. The adenoids are soft tissue located in the back of the throat, behind the nose and the roof of the mouth.  Growth or swelling in the upper part of the throat, just behind the nose (nasopharynx).  Damage to the ear caused by change in pressure. This is called barotrauma. What are the signs or symptoms? Symptoms of this condition include:  Ear pain.  Fever.  Problems with hearing.  Being tired.  Fluid leaking from the ear.  Ringing in the ear. How is this treated? This condition can go away on its own within 3-5 days. But if the condition is caused by bacteria or does not go away on its own, or if it keeps coming back, your doctor may:  Give you antibiotic medicines.  Give you medicines for pain. Follow these instructions at home:  Take over-the-counter and prescription medicines only as told by your doctor.  If you were prescribed an antibiotic medicine, take it as told by your doctor. Do not stop taking the antibiotic even if you start to feel better.  Keep all follow-up visits as told by your doctor. This is important. Contact a doctor if:  You have bleeding from your nose.  There is a lump on your neck.  You are not feeling better in 5 days.  You feel worse  instead of better. Get help right away if:  You have pain that is not helped with medicine.  You have swelling, redness, or pain around your ear.  You get a stiff neck.  You cannot move part of your face (paralysis).  You notice that the bone behind your ear hurts when you touch it.  You get a very bad headache. Summary  Otitis media means that the middle ear is red, swollen, and full of fluid.  This condition usually goes away on its own.  If the problem does not go away, treatment may be needed. You may be given medicines to treat the infection or to treat your pain.  If you were prescribed an antibiotic medicine, take it as told by your doctor. Do not stop taking the antibiotic even if you start to feel better.  Keep all follow-up visits as told by your doctor. This is important. This information is not intended to replace advice given to you by your health care provider. Make sure you discuss any questions you have with your health care provider. Document Revised: 12/27/2018 Document Reviewed: 12/27/2018 Elsevier Patient Education  2021 [employer]  

## 2020-06-11 NOTE — Progress Notes (Signed)
Patient ID: Tammy Barnett, female    DOB: [birth year], 20 y.o.   MRN: [id number]   Chief Complaint  Patient presents with  . Facial Pain    Pressure and congestion , fatigue , itchy throat   Subjective:  CC: facial pain and pressure  This is a new problem.  Presents today for an acute visit with a complaint of head pressure, stopped up head, itchy throat.  Symptoms have been present for 2 days.  Reports that symptoms are slightly better today.  Has tried over-the-counter medications with minimal relief.  Reports that she was in a house that has been discovered to have mold, wonders if this is related to her having frequent illnesses.  Reports fever of 100.7 yesterday, chills, fatigue, congestion, rhinorrhea, sinus pain or pressure, cough and headaches.    Medical History Tammy Barnett has a past medical history of Anxiety, Breast lump (09/10/2015), Depression, Dysmenorrhea (01/08/2014), Encounter for menstrual regulation (01/15/2014), Irregular intermenstrual bleeding (08/27/2014), Right ovarian cyst (01/15/2014), Ruptured ovarian cyst (01/08/2014), Thyroid nodule (09/10/2015), and Vaginal odor (04/16/2014).   Outpatient Encounter Medications as of 06/11/2020  Medication Sig  . acyclovir (ZOVIRAX) 400 MG tablet TAKE 1 TABLET BY MOUTH TWICE DAILY FOR PREVENTION  . amoxicillin (AMOXIL) 500 MG capsule Take 1 capsule (500 mg total) by mouth 2 (two) times daily for 10 days.  . Norethin Ace-Eth Estrad-FE 1-20 MG-MCG(24) CHEW CHEW AND SWALLOW 1 TABLET BY MOUTH ONCE DAILY (Patient taking differently: Chew 1 tablet by mouth daily.)  . famotidine (PEPCID) 40 MG tablet Take one tablet po each day (Patient not taking: Reported on 06/11/2020)   No facility-administered encounter medications on file as of 06/11/2020.     Review of Systems  Constitutional: Positive for chills, fatigue and fever.       T-MAX 100.7 last night  HENT: Positive for congestion, rhinorrhea, sinus pressure and sinus pain.        Throat feels  itching  Respiratory: Positive for cough. Negative for shortness of breath.   Cardiovascular: Negative for chest pain.  Neurological: Positive for headaches. Negative for dizziness and light-headedness.     Vitals Pulse (!) 105   Temp (!) 97.2 F (36.2 C)   Ht 5\' 7"  (1.702 m)   Wt 146 lb (66.2 kg)   SpO2 99%   BMI 22.87 kg/m   Objective:   Physical Exam Vitals reviewed.  Constitutional:      General: She is not in acute distress.    Appearance: Normal appearance. She is ill-appearing. She is not toxic-appearing.  HENT:     Right Ear: Tympanic membrane is erythematous.     Left Ear: Tympanic membrane normal.     Nose:     Right Turbinates: Swollen.     Left Turbinates: Swollen.     Right Sinus: Frontal sinus tenderness present. No maxillary sinus tenderness.     Left Sinus: Frontal sinus tenderness present. No maxillary sinus tenderness.     Mouth/Throat:     Pharynx: Posterior oropharyngeal erythema present.     Tonsils: No tonsillar exudate. 1+ on the right. 1+ on the left.  Cardiovascular:     Rate and Rhythm: Normal rate and regular rhythm.     Heart sounds: Normal heart sounds.  Pulmonary:     Effort: Pulmonary effort is normal.     Breath sounds: Normal breath sounds.  Abdominal:     General: Bowel sounds are normal.  Skin:    General: Skin is warm and dry.  Neurological:     General: No focal deficit present.     Mental Status: She is alert.  Psychiatric:        Behavior: Behavior normal.      Assessment and Plan   1. Sore throat - POCT rapid strep A - Novel Coronavirus, NAA ([facility]) - Culture, Group A Strep  2. Non-recurrent acute suppurative otitis media of right ear without spontaneous rupture of tympanic membrane - amoxicillin (AMOXIL) 500 MG capsule; Take 1 capsule (500 mg total) by mouth 2 (two) times daily for 10 days.  Dispense: 20 capsule; Refill: 0    Rapid strep negative, will send for culture. Right TM erythematous, will treat with  antibiotic for 10 days. Recommend supportive therapy, adequate hydration.   Agrees with plan of care discussed today. Understands warning signs to seek further care: chest pain, shortness of breath, any significant change in health.  Understands to follow-up if symptoms do not improve or worsen.  Will notify once results of COVID test and strep culture become available.  Recommend avoiding mold in the home if possible.   Pecolia Ades, NP 06/11/2020

## 2020-06-12 LAB — NOVEL CORONAVIRUS, NAA: SARS-CoV-2, NAA: DETECTED — AB

## 2020-06-12 LAB — SARS-COV-2, NAA 2 DAY TAT

## 2020-06-13 LAB — CULTURE, GROUP A STREP: Strep A Culture: NEGATIVE

## 2020-08-06 ENCOUNTER — Other Ambulatory Visit: Payer: Self-pay

## 2020-08-06 ENCOUNTER — Ambulatory Visit (INDEPENDENT_AMBULATORY_CARE_PROVIDER_SITE_OTHER): Payer: Medicaid Other | Admitting: Family Medicine

## 2020-08-06 VITALS — BP 120/74 | HR 84 | Temp 98.1°F | Ht 67.0 in | Wt 153.0 lb

## 2020-08-06 DIAGNOSIS — L259 Unspecified contact dermatitis, unspecified cause: Secondary | ICD-10-CM | POA: Diagnosis not present

## 2020-08-06 MED ORDER — PREDNISONE 10 MG PO TABS: ORAL_TABLET | ORAL | 0 refills | Status: DC

## 2020-08-06 MED ORDER — HYDROCORTISONE 2.5 % EX CREA: TOPICAL_CREAM | Freq: Two times a day (BID) | CUTANEOUS | 0 refills | Status: DC

## 2020-08-06 NOTE — Progress Notes (Signed)
Patient ID: Tammy Barnett, female    DOB: May 21, 2000, 20 y.o.   MRN: [id number]   Chief Complaint  Patient presents with   Rash   Subjective:    HPI Rash all over. Came up 5 days ago. Itchy. Has not tried any treatments because she states she cannot take benadryl.   [place]  On left hand rash.  Shower and lotion Then went up arms and on face and eyelids. Spreading. Not very itchy at this time. Not able to take benadryl at work due to making too drowsy. Antibacterial soap used since then. No new detergents or lotions Used a spray coppertone baby sunblock those 2 days when at the lake/pool.  Not on the back or on abdomen, also spared the new tattoo area she had on the rt forearm.   Not one else broke out with rash. All same sunscreen for all family members.   Medical History Tammy Barnett has a past medical history of Anxiety, Breast lump (09/10/2015), Depression, Dysmenorrhea (01/08/2014), Encounter for menstrual regulation (01/15/2014), Irregular intermenstrual bleeding (08/27/2014), Right ovarian cyst (01/15/2014), Ruptured ovarian cyst (01/08/2014), Thyroid nodule (09/10/2015), and Vaginal odor (04/16/2014).   Outpatient Encounter Medications as of 08/06/2020  Medication Sig   acyclovir (ZOVIRAX) 400 MG tablet TAKE 1 TABLET BY MOUTH TWICE DAILY FOR PREVENTION   hydrocortisone 2.5 % cream Apply topically 2 (two) times daily. To rash for 1 wk.   Norethin Ace-Eth Estrad-FE 1-20 MG-MCG(24) CHEW CHEW AND SWALLOW 1 TABLET BY MOUTH ONCE DAILY (Patient taking differently: Chew 1 tablet by mouth daily.)   predniSONE (DELTASONE) 10 MG tablet 4 tab p.o. for 2 days, then 3 tab x 2 days, then 2 tab x 2 days, then 1 tab x 2 days.   [DISCONTINUED] famotidine (PEPCID) 40 MG tablet Take one tablet po each day (Patient not taking: Reported on 06/11/2020)   No facility-administered encounter medications on file as of 08/06/2020.     Review of Systems  Constitutional:   Negative for chills and fever.  HENT:  Negative for congestion, rhinorrhea and sore throat.   Respiratory:  Negative for cough, shortness of breath and wheezing.   Cardiovascular:  Negative for chest pain and leg swelling.  Gastrointestinal:  Negative for abdominal pain, diarrhea, nausea and vomiting.  Genitourinary:  Negative for dysuria and frequency.  Musculoskeletal:  Negative for arthralgias and back pain.  Skin:  Positive for rash.  Neurological:  Negative for dizziness, weakness and headaches.    Vitals BP 120/74   Pulse 84   Temp 98.1 F (36.7 C)   Ht 5\' 7"  (1.702 m)   Wt 153 lb (69.4 kg)   SpO2 99%   BMI 23.96 kg/m   Objective:   Physical Exam Skin:    General: Skin is warm and dry.     Findings: Rash present.     Comments: +fine papules, mild erythema on face, neck, and arms.   Sparing abd/back.  Not on the tattoo either on her arm.   Assessment and Plan   1. Contact dermatitis, unspecified contact dermatitis type, unspecified trigger - predniSONE (DELTASONE) 10 MG tablet; 4 tab p.o. for 2 days, then 3 tab x 2 days, then 2 tab x 2 days, then 1 tab x 2 days.  Dispense: 20 tablet; Refill: 0 - hydrocortisone 2.5 % cream; Apply topically 2 (two) times daily. To rash for 1 wk.  Dispense: 60 g; Refill: 0   Use hypoallergenic soap and  detergent and change up the sunblock to see if will help avoid reaction.  Use cortisone cream given and prednisone. Benadryl prn it itching.  No follow-ups on file.

## 2020-09-22 ENCOUNTER — Telehealth: Payer: Self-pay | Admitting: Family Medicine

## 2020-09-22 ENCOUNTER — Other Ambulatory Visit: Payer: Self-pay

## 2020-09-22 DIAGNOSIS — D229 Melanocytic nevi, unspecified: Secondary | ICD-10-CM

## 2020-09-22 NOTE — Telephone Encounter (Signed)
Please go ahead with referral to dermatology

## 2020-09-22 NOTE — Telephone Encounter (Signed)
Patient is requesting a referral to dermalogist For some molds on her back.

## 2020-09-22 NOTE — Telephone Encounter (Signed)
Please advise. Thank you

## 2020-09-22 NOTE — Telephone Encounter (Signed)
Patient contacted and referral placed per drs orders.

## 2020-10-19 ENCOUNTER — Other Ambulatory Visit: Payer: Self-pay

## 2020-10-19 ENCOUNTER — Ambulatory Visit (INDEPENDENT_AMBULATORY_CARE_PROVIDER_SITE_OTHER): Payer: Medicaid Other | Admitting: Adult Health

## 2020-10-19 ENCOUNTER — Encounter: Payer: Self-pay | Admitting: Adult Health

## 2020-10-19 VITALS — BP 118/75 | HR 89 | Ht 65.5 in | Wt 161.0 lb

## 2020-10-19 DIAGNOSIS — N926 Irregular menstruation, unspecified: Secondary | ICD-10-CM

## 2020-10-19 DIAGNOSIS — Z3202 Encounter for pregnancy test, result negative: Secondary | ICD-10-CM | POA: Diagnosis not present

## 2020-10-19 DIAGNOSIS — L918 Other hypertrophic disorders of the skin: Secondary | ICD-10-CM | POA: Diagnosis not present

## 2020-10-19 DIAGNOSIS — D225 Melanocytic nevi of trunk: Secondary | ICD-10-CM | POA: Diagnosis not present

## 2020-10-19 DIAGNOSIS — Z113 Encounter for screening for infections with a predominantly sexual mode of transmission: Secondary | ICD-10-CM | POA: Insufficient documentation

## 2020-10-19 DIAGNOSIS — Z3041 Encounter for surveillance of contraceptive pills: Secondary | ICD-10-CM | POA: Diagnosis not present

## 2020-10-19 LAB — POCT URINE PREGNANCY: Preg Test, Ur: NEGATIVE

## 2020-10-19 MED ORDER — NORETHINDRONE ACET-ETHINYL EST 1.5-30 MG-MCG PO TABS: 1.0000 | ORAL_TABLET | Freq: Every day | ORAL | 4 refills | Status: DC

## 2020-10-19 NOTE — Progress Notes (Signed)
  Subjective:     Patient ID: Tammy Barnett, female   DOB: April 21, 2000, 20 y.o.   MRN: [id number]  HPI Tammy Barnett is a 20 year old white female,single, G0P0, in on follow up on OC and having periods like twice a month. PCP is [facility]   Review of Systems +Bleeding twice a month Reviewed past medical,surgical, social and family history. Reviewed medications and allergies.     Objective:   Physical Exam BP 118/75 (BP Location: Left Arm, Patient Position: Sitting, Cuff Size: Normal)   Pulse 89   Ht 5' 5.5" (1.664 m)   Wt 161 lb (73 kg)   LMP 10/07/2020   BMI 26.38 kg/m     UPT is negative. Skin warm and dry.  Lungs: clear to ausculation bilaterally. Cardiovascular: regular rate and rhythm.  Fall risk is low  Upstream - 10/19/20 1001       Pregnancy Intention Screening   Does the patient want to become pregnant in the next year? No    Does the patient's partner want to become pregnant in the next year? No    Would the patient like to discuss contraceptive options today? Yes      Contraception Wrap Up   Current Method Oral Contraceptive    End Method Oral Contraceptive             Assessment:     1. Screening for STD (sexually transmitted disease) Urine sent for GC/CHL - GC/Chlamydia Probe Amp  2. Pregnancy examination or test, negative result  - POCT urine pregnancy  3. Encounter for surveillance of contraceptive pills Finish current OCs and start Loestrin 1.5/30 Meds ordered this encounter  Medications   Norethindrone Acetate-Ethinyl Estradiol (LOESTRIN) 1.5-30 MG-MCG tablet    Sig: Take 1 tablet by mouth daily.    Dispense:  84 tablet    Refill:  4    Order Specific Question:   Supervising Provider    Answer:   Elonda Husky, LUTHER H [[id number]]     4. Irregular bleeding Will change OCs    Plan:     Return in 5 months for physical and first pap

## 2020-10-22 LAB — GC/CHLAMYDIA PROBE AMP
Chlamydia trachomatis, NAA: NEGATIVE
Neisseria Gonorrhoeae by PCR: NEGATIVE

## 2020-11-20 ENCOUNTER — Other Ambulatory Visit: Payer: Self-pay

## 2020-11-20 ENCOUNTER — Ambulatory Visit (INDEPENDENT_AMBULATORY_CARE_PROVIDER_SITE_OTHER): Payer: Medicaid Other | Admitting: Nurse Practitioner

## 2020-11-20 ENCOUNTER — Encounter: Payer: Self-pay | Admitting: Family Medicine

## 2020-11-20 ENCOUNTER — Encounter: Payer: Self-pay | Admitting: Nurse Practitioner

## 2020-11-20 ENCOUNTER — Other Ambulatory Visit: Payer: Self-pay | Admitting: Nurse Practitioner

## 2020-11-20 VITALS — BP 113/72 | HR 96 | Temp 98.2°F | Ht 65.5 in | Wt 164.0 lb

## 2020-11-20 DIAGNOSIS — T1490XA Injury, unspecified, initial encounter: Secondary | ICD-10-CM

## 2020-11-20 DIAGNOSIS — B9689 Other specified bacterial agents as the cause of diseases classified elsewhere: Secondary | ICD-10-CM | POA: Diagnosis not present

## 2020-11-20 DIAGNOSIS — J069 Acute upper respiratory infection, unspecified: Secondary | ICD-10-CM

## 2020-11-20 DIAGNOSIS — F40298 Other specified phobia: Secondary | ICD-10-CM

## 2020-11-20 MED ORDER — AMOXICILLIN-POT CLAVULANATE 875-125 MG PO TABS
1.0000 | ORAL_TABLET | Freq: Two times a day (BID) | ORAL | 0 refills | Status: DC
Start: 2020-11-20 — End: 2020-12-08

## 2020-11-20 NOTE — Progress Notes (Signed)
   Subjective:    Patient ID: Tammy Barnett, female    DOB: 2000/12/14, 20 y.o.   MRN: [id number]  HPI Cough mainly non productive x 1 week  Negative home covid test.  Slightly itchy throat which has resolved.  Cough is much deeper, now producing yellow mucus.  No fever.  No wheezing.  No chest pain.  No ear pain.  Slight sinus pressure.  No relief with OTC meds.  Taking fluids well.  Voiding normally.  Patient does vaping with nicotine products. At the end of the visit patient requested referral for therapy due to some abandonment issues and childhood trauma. No suicidal thoughts or ideation.      Objective:   Physical Exam NAD.  Alert, oriented.  Tearful at times.  Making good eye contact.  Dressed appropriately.  Speech clear.  Thoughts logical coherent and relevant.  TMs mild clear effusion, no erythema.  Pharynx mildly injected with yellowish PND noted.  Neck supple with mild soft anterior adenopathy.  Lungs clear.  Heart regular rate rhythm.  No tachypnea. Today's Vitals   11/20/20 1617  BP: 113/72  Pulse: 96  Temp: 98.2 F (36.8 C)  SpO2: 98%  Weight: 164 lb (74.4 kg)  Height: 5' 5.5" (1.664 m)   Body mass index is 26.88 kg/m.        Assessment & Plan:   Problem List Items Addressed This Visit       Other   Fear of abandonment   Relevant Orders   Ambulatory referral to Psychology   Trauma in childhood   Relevant Orders   Ambulatory referral to Psychology   Other Visit Diagnoses     Bacterial URI    -  Primary          Meds ordered this encounter  Medications   amoxicillin-clavulanate (AUGMENTIN) 875-125 MG tablet    Sig: Take 1 tablet by mouth 2 (two) times daily.    Dispense:  20 tablet    Refill:  0    Order Specific Question:   Supervising Provider    Answer:   Sallee Lange A [[id number]]   Continue OTC meds as directed.  Warning signs reviewed.  Call back in 7 to 10 days if no improvement, sooner if worse.  Per patient request will refer for  mental health counseling.  Defers medication at this time. Call back sooner if needed.

## 2020-11-26 ENCOUNTER — Encounter: Payer: Self-pay | Admitting: Nurse Practitioner

## 2020-12-02 ENCOUNTER — Telehealth: Payer: Self-pay | Admitting: Family Medicine

## 2020-12-02 ENCOUNTER — Telehealth: Payer: Self-pay | Admitting: Adult Health

## 2020-12-02 MED ORDER — FLUCONAZOLE 150 MG PO TABS
ORAL_TABLET | ORAL | 1 refills | Status: DC
Start: 2020-12-02 — End: 2021-03-05

## 2020-12-02 NOTE — Telephone Encounter (Signed)
Returned patient's call , she states she received the monistat rx from gyn . The cough has not getting better since 11/20/20 , please advise is there anyway she can be seen or can something be recommended for her cough and congestion.

## 2020-12-02 NOTE — Telephone Encounter (Signed)
So in her situation she needs to decide whether or not she wants to try different antibiotic.  It is quite possible this all could be viral and could take just a few weeks to go away on its own.  If she is having severe sinus tenderness and pain she may want to try different antibiotic.  As it is always antibiotics can in some situations triggers yeast infections.  Please see what she would like to do thank you

## 2020-12-02 NOTE — Telephone Encounter (Signed)
Patient saw Hoyle Sauer 10/14 and was prescribed an antibiotic and has caused a yeast infection. She has discontinued the antibiotic and tried Monostat. She is now complaining of extreme discomfort when she wipes. She does not know what to do from here. Please advise.   [place] [facility]   CB#  [phone]

## 2020-12-02 NOTE — Telephone Encounter (Signed)
Pt developed a yeast infection due to antibiotics for a cough, states she stopped the antibiotic & used OTC yeast treatment that offered no relief  Please advise & notify   [facility]

## 2020-12-02 NOTE — Telephone Encounter (Signed)
Will rx diflucan  

## 2020-12-03 NOTE — Telephone Encounter (Signed)
Left message for patient to return call for details .

## 2020-12-03 NOTE — Telephone Encounter (Signed)
Patient confirms she would like to have the antibiotic sent to [facility] [address] for ongoing congestion. She is concerned about yeast infections w/ antibiotics, was given diflucan by gyn .

## 2020-12-03 NOTE — Telephone Encounter (Signed)
Recommend amoxicillin 500 mg 1 taken 3 times daily for 7 days  All antibiotics can cause yeast infections if yeast infection occur recommend Diflucan

## 2020-12-04 MED ORDER — AMOXICILLIN 500 MG PO CAPS
ORAL_CAPSULE | ORAL | 0 refills | Status: DC
Start: 2020-12-04 — End: 2021-03-05

## 2020-12-04 MED ORDER — FLUCONAZOLE 150 MG PO TABS: ORAL_TABLET | ORAL | 0 refills | Status: DC

## 2020-12-04 NOTE — Telephone Encounter (Signed)
Medication sent in and my chart message sent to patient.

## 2020-12-04 NOTE — Telephone Encounter (Signed)
Pt received mychart message.

## 2020-12-08 ENCOUNTER — Other Ambulatory Visit (HOSPITAL_COMMUNITY)
Admission: RE | Admit: 2020-12-08 | Discharge: 2020-12-08 | Disposition: A | Discharge status: Home / Self Care | Payer: Medicaid Other | Source: Ambulatory Visit | Attending: Adult Health | Admitting: Adult Health

## 2020-12-08 ENCOUNTER — Encounter: Payer: Self-pay | Admitting: Adult Health

## 2020-12-08 ENCOUNTER — Ambulatory Visit (INDEPENDENT_AMBULATORY_CARE_PROVIDER_SITE_OTHER): Payer: Medicaid Other | Admitting: Adult Health

## 2020-12-08 ENCOUNTER — Other Ambulatory Visit: Payer: Self-pay

## 2020-12-08 VITALS — BP 128/71 | HR 103 | Ht 66.5 in | Wt 164.6 lb

## 2020-12-08 DIAGNOSIS — R053 Chronic cough: Secondary | ICD-10-CM | POA: Insufficient documentation

## 2020-12-08 DIAGNOSIS — N898 Other specified noninflammatory disorders of vagina: Secondary | ICD-10-CM | POA: Insufficient documentation

## 2020-12-08 MED ORDER — TRIAMCINOLONE ACETONIDE 0.5 % EX CREA
1.0000 | TOPICAL_CREAM | Freq: Two times a day (BID) | CUTANEOUS | 0 refills | Status: DC
Start: 2020-12-08 — End: 2021-03-05

## 2020-12-08 MED ORDER — MUCINEX DM MAXIMUM STRENGTH 60-1200 MG PO TB12: ORAL_TABLET | ORAL | 1 refills | Status: DC

## 2020-12-08 MED ORDER — NYSTATIN 100000 UNIT/GM EX CREA
1.0000 | TOPICAL_CREAM | Freq: Two times a day (BID) | CUTANEOUS | 0 refills | Status: DC
Start: 2020-12-08 — End: 2021-03-05

## 2020-12-08 NOTE — Progress Notes (Signed)
  Subjective:     Patient ID: Tammy Barnett, female   DOB: 2000-05-04, 20 y.o.   MRN: [id number]  HPI  Tammy Barnett is a 20 year old white female,single,G0P0, in complaining of vaginal irritation and feeling itchy. She has been on antibiotics and has used, monistat and diflucan and then Saturday had sores and hurt to walk in vagina, none now. PCP is Dr Sallee Lange.  Review of Systems Has vaginal irritation and itchy feeling Chronic cough Reviewed past medical,surgical, social and family history. Reviewed medications and allergies.     Objective:   Physical Exam BP 128/71 (BP Location: Right Arm, Patient Position: Sitting, Cuff Size: Normal)   Pulse (!) 103   Ht 5' 6.5" (1.689 m)   Wt 164 lb 9.6 oz (74.7 kg)   LMP  (LMP Unknown) Comment: None on OBC  BMI 26.17 kg/m     Skin warm and dry. Lungs: clear to ausculation bilaterally. Cardiovascular: regular rate and rhythm. Pelvic: external genitalia is normal in appearance no lesions, vagina: tan discharge without odor,urethra has no lesions or masses noted, cervix:smooth, uterus: normal size, shape and contour, non tender, no masses felt, adnexa: no masses or tenderness noted. Bladder is non tender and no masses felt. CV swab obtained. Fall risk is low.  [facility] - 12/08/20 0836       Pregnancy Intention Screening   Does the patient want to become pregnant in the next year? No    Does the patient's partner want to become pregnant in the next year? No    Would the patient like to discuss contraceptive options today? No      Contraception Wrap Up   Current Method Oral Contraceptive    End Method Oral Contraceptive    Contraception Counseling Provided No            Examination chaperoned by [employer].     Assessment:     1. Vaginal irritation Will rx nystatin and triamcinolone cream for outside tissues Meds ordered this encounter  Medications   triamcinolone cream (KENALOG) 0.5 %    Sig: Apply 1 application topically 2 (two)  times daily.    Dispense:  30 g    Refill:  0    Order Specific Question:   Supervising Provider    Answer:   Tania Ade H [[id number]]   nystatin cream (MYCOSTATIN)    Sig: Apply 1 application topically 2 (two) times daily.    Dispense:  30 g    Refill:  0    Order Specific Question:   Supervising Provider    Answer:   Elonda Husky, LUTHER H [[id number]]   Dextromethorphan-guaiFENesin (MUCINEX DM MAXIMUM STRENGTH) 60-1200 MG TB12    Sig: Take 1 every 12 hours prn cough    Dispense:  28 tablet    Refill:  1    Order Specific Question:   Supervising Provider    Answer:   Elonda Husky, LUTHER H [[id number]]     2. Vaginal discharge CV swab sent for GC/CHL,trich,BV and yeast   3. Chronic cough Try mucinex DM     Plan:    Discussed if gets sores again, take a picture Follow up prn

## 2020-12-09 LAB — CERVICOVAGINAL ANCILLARY ONLY
Bacterial Vaginitis (gardnerella): NEGATIVE
Candida Glabrata: NEGATIVE
Candida Vaginitis: NEGATIVE
Chlamydia: NEGATIVE
Comment: NEGATIVE
Comment: NEGATIVE
Comment: NEGATIVE
Comment: NEGATIVE
Comment: NEGATIVE
Comment: NORMAL
Neisseria Gonorrhea: NEGATIVE
Trichomonas: NEGATIVE

## 2020-12-14 ENCOUNTER — Ambulatory Visit: Payer: Medicaid Other | Admitting: Adult Health

## 2021-01-18 ENCOUNTER — Other Ambulatory Visit: Payer: Self-pay

## 2021-01-18 ENCOUNTER — Emergency Department: Admit: 2021-01-18 | Discharge status: Absent without leave | Payer: Self-pay

## 2021-01-18 ENCOUNTER — Emergency Department (INDEPENDENT_AMBULATORY_CARE_PROVIDER_SITE_OTHER): Payer: Medicaid Other

## 2021-01-18 ENCOUNTER — Emergency Department (INDEPENDENT_AMBULATORY_CARE_PROVIDER_SITE_OTHER)
Admission: EM | Admit: 2021-01-18 | Discharge: 2021-01-18 | Disposition: A | Discharge status: Home / Self Care | Payer: Medicaid Other | Source: Home / Self Care

## 2021-01-18 DIAGNOSIS — R059 Cough, unspecified: Secondary | ICD-10-CM

## 2021-01-18 DIAGNOSIS — J0100 Acute maxillary sinusitis, unspecified: Secondary | ICD-10-CM

## 2021-01-18 DIAGNOSIS — J309 Allergic rhinitis, unspecified: Secondary | ICD-10-CM | POA: Diagnosis not present

## 2021-01-18 MED ORDER — BENZONATATE 200 MG PO CAPS: 200.0000 mg | ORAL_CAPSULE | Freq: Three times a day (TID) | ORAL | 0 refills | Status: AC | PRN

## 2021-01-18 MED ORDER — FLUCONAZOLE 150 MG PO TABS: 150.0000 mg | ORAL_TABLET | Freq: Every day | ORAL | Status: DC

## 2021-01-18 MED ORDER — FEXOFENADINE HCL 180 MG PO TABS: 180.0000 mg | ORAL_TABLET | Freq: Every day | ORAL | 0 refills | Status: DC

## 2021-01-18 MED ORDER — CEFDINIR 300 MG PO CAPS: 300.0000 mg | ORAL_CAPSULE | Freq: Two times a day (BID) | ORAL | 0 refills | Status: AC

## 2021-01-18 NOTE — ED Triage Notes (Signed)
Pt presents with cough that began in October and a pounding HA x2 weeks

## 2021-01-18 NOTE — Discharge Instructions (Addendum)
Advised patient of CXR results today.  Advised patient to take medication as directed with food to completion.  Advised patient to take Allegra with first dose of Cefdinir for the next 5 of 7 days.  May use afterwards for concurrent postnasal drip/drainage.  Advised patient may use Tessalon Perles daily or as needed for cough.  Encouraged patient to increase daily water intake while taking these medications

## 2021-01-18 NOTE — ED Provider Notes (Signed)
[name]    CSN: [id number] Arrival date & time: 01/18/21  1222      History   Chief Complaint Chief Complaint  Patient presents with   Cough    HPI Tammy Barnett is a 20 y.o. female.   HPI 20 year old female presents with cough that began on October 1 and pounding headache for 2 weeks.  PMH significant for chronic cough.  Past Medical History:  Diagnosis Date   Anxiety    Breast lump 09/10/2015   Depression    Dysmenorrhea 01/08/2014   Encounter for menstrual regulation 01/15/2014   Irregular intermenstrual bleeding 08/27/2014   Right ovarian cyst 01/15/2014   Ruptured ovarian cyst 01/08/2014   Thyroid nodule 09/10/2015   Vaginal odor 04/16/2014    Patient Active Problem List   Diagnosis Date Noted   Chronic cough 12/08/2020   Vaginal irritation 12/08/2020   Fear of abandonment 11/20/2020   Trauma in childhood 11/20/2020   Irregular bleeding 10/19/2020   Encounter for surveillance of contraceptive pills 10/19/2020   Pregnancy examination or test, negative result 10/19/2020   Screening for STD (sexually transmitted disease) 10/19/2020   Sore throat 06/11/2020   Non-recurrent acute suppurative otitis media of right ear without spontaneous rupture of tympanic membrane 06/11/2020   Screening for tuberculosis 01/29/2020   Dyspareunia, female 10/16/2019   Vaginal discharge 10/16/2019   Burning with urination 10/16/2019   Anxiety 04/11/2017   Breast lump 09/10/2015   Thyroid nodule 09/10/2015   Fever blister 10/22/2014   Breakthrough bleeding on birth control pills 08/27/2014   Wellness examination 01/15/2014   Right ovarian cyst 01/15/2014   Ruptured ovarian cyst 01/08/2014   Dysmenorrhea 01/08/2014   Osgood-Schlatter's disease 10/14/2013    History reviewed. No pertinent surgical history.  OB History     Gravida  0   Para  0   Term  0   Preterm  0   AB  0   Living  0      SAB  0   IAB  0   Ectopic  0   Multiple  0   Live  Births               Home Medications    Prior to Admission medications   Medication Sig Start Date End Date Taking? Authorizing Provider  benzonatate (TESSALON) 200 MG capsule Take 1 capsule (200 mg total) by mouth 3 (three) times daily as needed for up to 7 days for cough. 01/18/21 01/25/21 Yes Eliezer Lofts, FNP  cefdinir (OMNICEF) 300 MG capsule Take 1 capsule (300 mg total) by mouth 2 (two) times daily for 7 days. 01/18/21 01/25/21 Yes Eliezer Lofts, FNP  fexofenadine [facility] ALLERGY) 180 MG tablet Take 1 tablet (180 mg total) by mouth daily for 15 days. 01/18/21 02/02/21 Yes Eliezer Lofts, FNP  amoxicillin (AMOXIL) 500 MG capsule Take one capsule po 3 times daily for 7 days. Patient not taking: Reported on 12/08/2020 12/04/20   Kathyrn Drown, MD  Dextromethorphan-guaiFENesin [facility] DM MAXIMUM STRENGTH) 60-1200 MG TB12 Take 1 every 12 hours prn cough 12/08/20   Estill Dooms, NP  fluconazole (DIFLUCAN) 150 MG tablet Take 1 now and 1 in 3 days Patient not taking: Reported on 12/08/2020 12/02/20   Estill Dooms, NP  Norethindrone Acetate-Ethinyl Estradiol (LOESTRIN) 1.5-30 MG-MCG tablet Take 1 tablet by mouth daily. 10/19/20   Estill Dooms, NP  nystatin cream (MYCOSTATIN) Apply 1 application topically 2 (two) times daily. Patient not taking:  Reported on 01/18/2021 12/08/20   Estill Dooms, NP  triamcinolone cream (KENALOG) 0.5 % Apply 1 application topically 2 (two) times daily. Patient not taking: Reported on 01/18/2021 12/08/20   Estill Dooms, NP    Family History Family History  Problem Relation Age of Onset   Hypertension Father    Diabetes Paternal Grandmother    Hypertension Paternal Grandfather     Social History Social History   Tobacco Use   Smoking status: Never   Smokeless tobacco: Never  Vaping Use   Vaping Use: Every day  Substance Use Topics   Alcohol use: No   Drug use: No     Allergies   Patient has no known  allergies.   Review of Systems Review of Systems  Respiratory:  Positive for cough.   Neurological:  Positive for headaches.  All other systems reviewed and are negative.   Physical Exam Triage Vital Signs ED Triage Vitals  Enc Vitals Group     BP 01/18/21 1257 121/78     Pulse Rate 01/18/21 1257 85     Resp 01/18/21 1257 12     Temp 01/18/21 1257 98.9 F (37.2 C)     Temp Source 01/18/21 1257 Oral     SpO2 01/18/21 1257 100 %     Weight --      Height --      Head Circumference --      Peak Flow --      Pain Score 01/18/21 1259 2     Pain Loc --      Pain Edu? --      Excl. in [place]? --    No data found.  Updated Vital Signs BP 121/78 (BP Location: Left Arm)   Pulse 85   Temp 98.9 F (37.2 C) (Oral)   Resp 12   LMP 01/14/2021   SpO2 100%    Physical Exam Vitals and nursing note reviewed.  Constitutional:      General: She is not in acute distress.    Appearance: Normal appearance. She is obese. She is not ill-appearing.  HENT:     Head: Normocephalic and atraumatic.     Right Ear: Tympanic membrane, ear canal and external ear normal.     Left Ear: Tympanic membrane, ear canal and external ear normal.     Mouth/Throat:     Mouth: Mucous membranes are moist.     Pharynx: Oropharynx is clear.  Eyes:     Extraocular Movements: Extraocular movements intact.     Conjunctiva/sclera: Conjunctivae normal.     Pupils: Pupils are equal, round, and reactive to light.  Cardiovascular:     Rate and Rhythm: Normal rate and regular rhythm.     Pulses: Normal pulses.     Heart sounds: Normal heart sounds.  Pulmonary:     Effort: Pulmonary effort is normal.     Breath sounds: Normal breath sounds.  Musculoskeletal:     Cervical back: Normal range of motion and neck supple.  Skin:    General: Skin is warm and dry.  Neurological:     General: No focal deficit present.     Mental Status: She is alert and oriented to person, place, and time. Mental status is at  baseline.     UC Treatments / Results  Labs (all labs ordered are listed, but only abnormal results are displayed) Labs Reviewed - No data to display  EKG   Radiology DG Chest 2 View  Result  Date: 01/18/2021 CLINICAL DATA:  Cough EXAM: CHEST - 2 VIEW COMPARISON:  01/05/2010 FINDINGS: The heart size and mediastinal contours are within normal limits. Both lungs are clear. The visualized skeletal structures are unremarkable. IMPRESSION: No active cardiopulmonary disease. Electronically Signed   By: Davina Poke D.O.   On: 01/18/2021 13:58    Procedures Procedures (including critical care time)  Medications Ordered in UC Medications - No data to display  Initial Impression / Assessment and Plan / UC Course  I have reviewed the triage vital signs and the nursing notes.  Pertinent labs & imaging results that were available during my care of the patient were reviewed by me and considered in my medical decision making (see chart for details).     MDM: 1.  Acute maxillary sinusitis, recurrence not specified-Rx'd cefdinir; 2.  Cough-CXR was unremarkable for acute cardiopulmonary disease, Rx Tessalon Perles; 3.  Allergic rhinitis-Rx'd Allegra. Advised patient of CXR results today.  Advised patient to take medication as directed with food to completion.  Advised patient to take Allegra with first dose of Cefdinir for the next 5 of 7 days.  May use afterwards for concurrent postnasal drip/drainage.  Advised patient may use Tessalon Perles daily or as needed for cough. Encouraged patient to increase daily water intake while taking these medications. Work and school note provided for patient prior to discharge.  Patient discharged home, hemodynamically stable. Final Clinical Impressions(s) / UC Diagnoses   Final diagnoses:  Acute maxillary sinusitis, recurrence not specified  Cough, unspecified type  Allergic rhinitis, unspecified seasonality, unspecified trigger     Discharge  Instructions      Advised patient of CXR results today.  Advised patient to take medication as directed with food to completion.  Advised patient to take Allegra with first dose of Cefdinir for the next 5 of 7 days.  May use afterwards for concurrent postnasal drip/drainage.  Advised patient may use Tessalon Perles daily or as needed for cough.  Encouraged patient to increase daily water intake while taking these medications     ED Prescriptions     Medication Sig Dispense Auth. Provider   cefdinir (OMNICEF) 300 MG capsule Take 1 capsule (300 mg total) by mouth 2 (two) times daily for 7 days. 14 capsule Eliezer Lofts, FNP   fexofenadine [facility] ALLERGY) 180 MG tablet Take 1 tablet (180 mg total) by mouth daily for 15 days. 15 tablet Eliezer Lofts, FNP   benzonatate (TESSALON) 200 MG capsule Take 1 capsule (200 mg total) by mouth 3 (three) times daily as needed for up to 7 days for cough. 40 capsule Eliezer Lofts, FNP      PDMP not reviewed this encounter.   Eliezer Lofts, [place] 01/18/21 1444

## 2021-01-25 ENCOUNTER — Ambulatory Visit: Payer: Medicaid Other | Admitting: Family Medicine

## 2021-01-25 ENCOUNTER — Encounter: Payer: Self-pay | Admitting: Family Medicine

## 2021-03-05 ENCOUNTER — Other Ambulatory Visit: Payer: Self-pay

## 2021-03-05 ENCOUNTER — Encounter: Payer: Self-pay | Admitting: Nurse Practitioner

## 2021-03-05 ENCOUNTER — Encounter: Payer: Self-pay | Admitting: Family Medicine

## 2021-03-05 ENCOUNTER — Ambulatory Visit (INDEPENDENT_AMBULATORY_CARE_PROVIDER_SITE_OTHER): Payer: Medicaid Other | Admitting: Nurse Practitioner

## 2021-03-05 VITALS — BP 130/78 | HR 82 | Temp 98.2°F | Ht 66.5 in | Wt 178.0 lb

## 2021-03-05 DIAGNOSIS — L989 Disorder of the skin and subcutaneous tissue, unspecified: Secondary | ICD-10-CM | POA: Diagnosis not present

## 2021-03-05 MED ORDER — DOXYCYCLINE HYCLATE 100 MG PO TABS: 100.0000 mg | ORAL_TABLET | Freq: Two times a day (BID) | ORAL | 0 refills | Status: DC

## 2021-03-05 NOTE — Progress Notes (Signed)
° °  Subjective:    Patient ID: Tammy Barnett, female    DOB: 25-Jan-2001, 21 y.o.   MRN: [id number]  HPI Possible bug bite approx 1 week ago at house in [place].  Does not remember a specific insect bite.  Started as a tiny bump on 1/21.  Had a bad headache for 2 days on 1/20 3-02/2022 which has resolved.  Over the past couple of days has noticed more heat in the lesion and has gotten larger as far as erythema.  No other rashes been noted.  No fevers.  Nonpruritic.  Has become slightly painful.         Objective:   Physical Exam Slightly raised moderately erythematous area in the left upper anterior thigh with approximately 2 to 3 cm faint pink discoloration surrounding.  Mild edema at the center of the lesion, mildly tender to palpation.  Otherwise skin is clear.       Assessment & Plan:  Skin lesion of left leg Meds ordered this encounter  Medications   doxycycline (VIBRA-TABS) 100 MG tablet    Sig: Take 1 tablet (100 mg total) by mouth 2 (two) times daily.    Dispense:  20 tablet    Refill:  0    Order Specific Question:   Supervising Provider    Answer:   Sallee Lange A [[id number]]   Coverage for possible MRSA as well as an insect bite.  Warning signs reviewed including fever and worsening swelling and erythema at the site.  Warm compresses.  Ibuprofen for discomfort.  Call back next week if no improvement, go to ED or urgent care this weekend if worse.

## 2021-04-12 ENCOUNTER — Encounter: Payer: Self-pay | Admitting: Nurse Practitioner

## 2021-04-12 ENCOUNTER — Other Ambulatory Visit: Payer: Self-pay

## 2021-04-12 ENCOUNTER — Ambulatory Visit (INDEPENDENT_AMBULATORY_CARE_PROVIDER_SITE_OTHER): Payer: Medicaid Other | Admitting: Nurse Practitioner

## 2021-04-12 VITALS — BP 120/78 | HR 99 | Temp 98.0°F | Ht 66.5 in | Wt 180.0 lb

## 2021-04-12 DIAGNOSIS — G43009 Migraine without aura, not intractable, without status migrainosus: Secondary | ICD-10-CM

## 2021-04-12 MED ORDER — TOPIRAMATE 25 MG PO TABS: 25.0000 mg | ORAL_TABLET | Freq: Every day | ORAL | 1 refills | Status: DC

## 2021-04-12 NOTE — Addendum Note (Signed)
Addended by: Claire Shown on: 04/12/2021 12:26 PM ? ? Modules accepted: Level of Service ? ?

## 2021-04-12 NOTE — Progress Notes (Addendum)
? ?  Subjective:  ? ? Patient ID: Tammy Barnett, female    DOB: 03/09/2000, 21 y.o.   MRN: [id number] ? ?HPI ?Patient is here for headaches that have been going on for 3 weeks.  Patient describes headaches as dull and sometimes throbbing headaches located to bilateral temples.  Patient admits to light sensitivity during headaches.  Patient denies any nausea, vomiting, blurred visions, nighttime headaches, dizziness.   ? ?Patient has history of migraines and has used steroid shots in the past.  Patient has recently had massages, aromatherapy, and has used ibuprofen and Tylenol with minimal benefit. ? ?Review of Systems  ?Neurological:  Positive for headaches.  ?All other systems reviewed and are negative. ? ?   ?Objective:  ? Physical Exam ?Constitutional:   ?   General: She is not in acute distress. ?   Appearance: She is well-developed and normal weight. She is not ill-appearing or toxic-appearing.  ?Eyes:  ?   General: No visual field deficit or scleral icterus. ?   Extraocular Movements: Extraocular movements intact.  ?   Right eye: Normal extraocular motion and no nystagmus.  ?   Left eye: Normal extraocular motion and no nystagmus.  ?   Pupils: Pupils are equal, round, and reactive to light. Pupils are equal.  ?   Right eye: Pupil is round and reactive.  ?   Left eye: Pupil is round and reactive.  ?Cardiovascular:  ?   Rate and Rhythm: Normal rate and regular rhythm.  ?   Heart sounds: Normal heart sounds. No murmur heard. ?Pulmonary:  ?   Effort: Pulmonary effort is normal. No respiratory distress.  ?   Breath sounds: No wheezing.  ?Musculoskeletal:     ?   General: Normal range of motion.  ?   Cervical back: Normal range of motion.  ?Skin: ?   General: Skin is warm.  ?   Capillary Refill: Capillary refill takes less than 2 seconds.  ?Neurological:  ?   Mental Status: She is alert and oriented to person, place, and time.  ?   Cranial Nerves: No cranial nerve deficit, dysarthria or facial asymmetry.  ?    Sensory: No sensory deficit.  ?   Motor: No weakness.  ?   Coordination: Coordination normal.  ?   Gait: Gait normal.  ?Psychiatric:     ?   Mood and Affect: Mood normal.     ?   Behavior: Behavior normal.  ? ? ? ? ? ?   ?Assessment & Plan:  ? ?1. Migraine without aura and without status migrainosus, not intractable ?-Patient has daily migraines and I believe will benefit from preventative migraine therapy. ?- topiramate (TOPAMAX) 25 MG tablet; Take 1 tablet (25 mg total) by mouth daily.  Dispense: 30 tablet; Refill: 1 ?-May increase topiramate to twice a day after 1 week if 25 mg not enough to manage headaches. ?-Discussed side effects of topiramate in detail. ?-Patient currently takes birth control due to having cyst.  Patient instructed to notify clinic if she experiences any breakthrough bleeding while on topiramate.  Patient also instructed to use backup method for intimacy while using topiramate. ?-Follow-up in 4 weeks. ?-May continue to use ibuprofen and Tylenol for abortive therapy. ?-If symptoms have not improved with topiramate use we will consider referral to neurology and/or headache log. ? ? ? ?Note:  This document was prepared using Dragon voice recognition software and may include unintentional dictation errors. ? ?

## 2021-05-10 ENCOUNTER — Ambulatory Visit: Payer: Medicaid Other | Admitting: Nurse Practitioner

## 2021-05-10 ENCOUNTER — Encounter: Payer: Self-pay | Admitting: Nurse Practitioner

## 2021-06-21 ENCOUNTER — Other Ambulatory Visit (HOSPITAL_COMMUNITY)
Admission: RE | Admit: 2021-06-21 | Discharge: 2021-06-21 | Disposition: A | Discharge status: Home / Self Care | Payer: Medicaid Other | Source: Ambulatory Visit | Attending: Adult Health | Admitting: Adult Health

## 2021-06-21 ENCOUNTER — Encounter: Payer: Self-pay | Admitting: Adult Health

## 2021-06-21 ENCOUNTER — Ambulatory Visit (INDEPENDENT_AMBULATORY_CARE_PROVIDER_SITE_OTHER): Payer: Medicaid Other | Admitting: Adult Health

## 2021-06-21 VITALS — BP 130/75 | HR 86 | Ht 65.0 in | Wt 186.0 lb

## 2021-06-21 DIAGNOSIS — F419 Anxiety disorder, unspecified: Secondary | ICD-10-CM | POA: Diagnosis not present

## 2021-06-21 DIAGNOSIS — Z0000 Encounter for general adult medical examination without abnormal findings: Secondary | ICD-10-CM

## 2021-06-21 DIAGNOSIS — Z3041 Encounter for surveillance of contraceptive pills: Secondary | ICD-10-CM | POA: Diagnosis not present

## 2021-06-21 DIAGNOSIS — Z01419 Encounter for gynecological examination (general) (routine) without abnormal findings: Secondary | ICD-10-CM | POA: Insufficient documentation

## 2021-06-21 DIAGNOSIS — G479 Sleep disorder, unspecified: Secondary | ICD-10-CM | POA: Diagnosis not present

## 2021-06-21 MED ORDER — TRAZODONE HCL 50 MG PO TABS: 50.0000 mg | ORAL_TABLET | Freq: Every evening | ORAL | 1 refills | Status: DC | PRN

## 2021-06-21 MED ORDER — NORETHINDRONE ACET-ETHINYL EST 1.5-30 MG-MCG PO TABS: 1.0000 | ORAL_TABLET | Freq: Every day | ORAL | 4 refills | Status: DC

## 2021-06-21 NOTE — Progress Notes (Signed)
Patient ID: Tammy Barnett, female   DOB: [birth year], 21 y.o.   MRN: [id number] ?History of Present Illness: ?Tammy Barnett is a 21 year old white female,single, G0P0 in for a well woman gyn exam and first pap. She is happy with her OCs. ?PCP is Dr Sallee Lange. ? ? ?Current Medications, Allergies, Past Medical History, Past Surgical History, Family History and Social History were reviewed in [employer] record.   ? ? ?Review of Systems: ?Patient denies any daily headaches, hearing loss, fatigue, blurred vision, shortness of breath, chest pain, abdominal pain, problems with bowel movements, urination, or intercourse. No joint pain or mood swings.  ?Does not sleep well, she stresses over stuff ? ? ?Physical Exam:BP 130/75 (BP Location: Left Arm, Patient Position: Sitting, Cuff Size: Normal)   Pulse 86   Ht '5\' 5"'$  (1.651 m)   Wt 186 lb (84.4 kg)   LMP  (LMP Unknown)   BMI 30.95 kg/m?   ?General:  Well developed, well nourished, no acute distress ?Skin:  Warm and dry,has several tattoos  ?Neck:  Midline trachea, normal thyroid, good ROM, no lymphadenopathy ?Lungs; Clear to auscultation bilaterally ?Breast:  No dominant palpable mass, retraction, or nipple discharge ?Cardiovascular: Regular rate and rhythm ?Abdomen:  Soft, non tender, no hepatosplenomegaly ?Pelvic:  External genitalia is normal in appearance, no lesions.  The vagina is normal in appearance. Urethra has no lesions or masses. The cervix is smooth and nulliparous, pap with GC/CHL performed.  Uterus is felt to be normal size, shape, and contour.  No adnexal masses or tenderness noted.Bladder is non tender, no masses felt. ?Extremities/musculoskeletal:  No swelling or varicosities noted, no clubbing or cyanosis ?Psych:  No mood changes, alert and cooperative,seems happy ?AA is 3 ?Fall risk is low ? ?  06/21/2021  ? 11:36 AM 03/05/2021  ? 10:31 AM 08/06/2020  ?  2:50 PM  ?Depression screen PHQ 2/9  ?Decreased Interest 0 0 0  ?Down,  Depressed, Hopeless 0 0 0  ?PHQ - 2 Score 0 0 0  ?Altered sleeping 2    ?Tired, decreased energy 1    ?Change in appetite 3    ?Feeling bad or failure about yourself  0    ?Trouble concentrating 0    ?Moving slowly or fidgety/restless 0    ?Suicidal thoughts 0    ?PHQ-9 Score 6    ?  ? ?  06/21/2021  ? 11:36 AM 01/29/2020  ? 10:44 AM  ?GAD 7 : Generalized Anxiety Score  ?Nervous, Anxious, on Edge 0 0  ?Control/stop worrying 1 0  ?Worry too much - different things 1 0  ?Trouble relaxing 1 0  ?Restless 0 0  ?Easily annoyed or irritable 1 0  ?Afraid - awful might happen 0 0  ?Total GAD 7 Score 4 0  ?Anxiety Difficulty  Not difficult at all  ? ?  ? [place] - 06/21/21 1136   ? ?  ? Pregnancy Intention Screening  ? Does the patient want to become pregnant in the next year? No   ? Does the patient's partner want to become pregnant in the next year? No   ? Would the patient like to discuss contraceptive options today? No   ?  ? Contraception Wrap Up  ? Current Method Oral Contraceptive   ? End Method Oral Contraceptive   ? Contraception Counseling Provided No   ? ?  ?  ? ?  ? Examination chaperoned by Marcelino Scot RN ? ?Impression and Plan: ?  1. Encounter for gynecological examination with Papanicolaou smear of cervix ?Pap sent ?Physical in 1 year ?Pap in 3 if normal ? ? ?2. Encounter for surveillance of contraceptive pills ?Will refill Loestrin 1.5/30 1 daily  ?Meds ordered this encounter  ?Medications  ? traZODone (DESYREL) 50 MG tablet  ?  Sig: Take 1 tablet (50 mg total) by mouth at bedtime as needed for sleep.  ?  Dispense:  30 tablet  ?  Refill:  1  ?  Order Specific Question:   Supervising Provider  ?  Answer:   Tania Ade H [[id number]]  ? Norethindrone Acetate-Ethinyl Estradiol (LOESTRIN) 1.5-30 MG-MCG tablet  ?  Sig: Take 1 tablet by mouth daily.  ?  Dispense:  84 tablet  ?  Refill:  4  ?  Order Specific Question:   Supervising Provider  ?  Answer:   Tania Ade H [[id number]]  ?  ? ?3. Sleep disturbance ?Will try trazodone  50 mg 1 at hs prn, can 1/2 if too drowsy next day ? ?4. Anxiety ?Will try trazodone  ?Can message me in follow up after few weeks  ? ? ? ?  ?  ?

## 2021-06-25 LAB — CYTOLOGY - PAP
Chlamydia: NEGATIVE
Comment: NEGATIVE
Comment: NEGATIVE
Comment: NORMAL
Diagnosis: UNDETERMINED — AB
High risk HPV: POSITIVE — AB
Neisseria Gonorrhea: NEGATIVE

## 2021-06-28 ENCOUNTER — Encounter: Payer: Self-pay | Admitting: Adult Health

## 2021-06-28 DIAGNOSIS — R87610 Atypical squamous cells of undetermined significance on cytologic smear of cervix (ASC-US): Secondary | ICD-10-CM

## 2021-06-28 HISTORY — DX: Atypical squamous cells of undetermined significance on cytologic smear of cervix (ASC-US): R87.610

## 2021-06-30 ENCOUNTER — Ambulatory Visit (INDEPENDENT_AMBULATORY_CARE_PROVIDER_SITE_OTHER): Payer: Medicaid Other | Admitting: Family Medicine

## 2021-06-30 VITALS — BP 126/78 | HR 84 | Temp 98.2°F | Wt 188.8 lb

## 2021-06-30 DIAGNOSIS — J020 Streptococcal pharyngitis: Secondary | ICD-10-CM | POA: Insufficient documentation

## 2021-06-30 LAB — POCT RAPID STREP A (OFFICE): Rapid Strep A Screen: POSITIVE — AB

## 2021-06-30 MED ORDER — AMOXICILLIN 875 MG PO TABS: 875.0000 mg | ORAL_TABLET | Freq: Two times a day (BID) | ORAL | 0 refills | Status: AC

## 2021-06-30 NOTE — Patient Instructions (Signed)
Antibiotic as prescribed.  Take care  Dr. Reid Regas  

## 2021-06-30 NOTE — Progress Notes (Signed)
Subjective:  Patient ID: Tammy Barnett, female    DOB: 2000/07/15  Age: 21 y.o. MRN: [id number]  CC: Chief Complaint  Patient presents with   Fever   Sore Throat    Really red, white patches and hurts to swallow.    HPI:  21 year old female presents for evaluation of the above.  Patient reports that symptoms started yesterday.  Worsened last night.  She reports sore throat which is quite severe.  She states that her throat is red.  She states it hurts to swallow.  She endorses chills and sweats.  No documented fever.  She has some congestion but states that her predominant complaint is the sore throat.  No reported sick contacts.  No relieving factors.  No other complaints or concerns at this time.  Patient Active Problem List   Diagnosis Date Noted   Strep throat 06/30/2021   ASCUS with positive high risk HPV cervical 06/28/2021   Sleep disturbance 06/21/2021   Trauma in childhood 11/20/2020   Anxiety 04/11/2017   Thyroid nodule 09/10/2015    Social Hx   Social History   Socioeconomic History   Marital status: Single    Spouse name: Not on file   Number of children: Not on file   Years of education: Not on file   Highest education level: Not on file  Occupational History   Not on file  Tobacco Use   Smoking status: Never   Smokeless tobacco: Never  Vaping Use   Vaping Use: Every day  Substance and Sexual Activity   Alcohol use: Yes    Comment: occ   Drug use: No   Sexual activity: Yes    Birth control/protection: Pill  Other Topics Concern   Not on file  Social History Narrative   Not on file   Social Determinants of Health   Financial Resource Strain: Low Risk    Difficulty of Paying Living Expenses: Not very hard  Food Insecurity: No Food Insecurity   Worried About Charity fundraiser in the Last Year: Never true   Ran Out of Food in the Last Year: Never true  Transportation Needs: No Transportation Needs   Lack of Transportation (Medical): No    Lack of Transportation (Non-Medical): No  Physical Activity: Insufficiently Active   Days of Exercise per Week: 2 days   Minutes of Exercise per Session: 60 min  Stress: Stress Concern Present   Feeling of Stress : To some extent  Social Connections: Moderately Integrated   Frequency of Communication with Friends and Family: More than three times a week   Frequency of Social Gatherings with Friends and Family: Once a week   Attends Religious Services: 1 to 4 times per year   Active Member of [employer] or Organizations: No   Attends Music therapist: Never   Marital Status: Living with partner    Review of Systems Per HPI  Objective:  BP 126/78   Pulse 84   Temp 98.2 F (36.8 C) (Oral)   Wt 188 lb 12.8 oz (85.6 kg)   LMP  (LMP Unknown)   SpO2 100%   BMI 31.42 kg/m      06/30/2021    2:39 PM 06/21/2021   11:38 AM 04/12/2021   10:50 AM  BP/Weight  Systolic BP 629 476 546  Diastolic BP 78 75 78  Wt. (Lbs) 188.8 186 180  BMI 31.42 kg/m2 30.95 kg/m2 28.62 kg/m2    Physical Exam Vitals and nursing note  reviewed.  Constitutional:      General: She is not in acute distress.    Appearance: Normal appearance.  HENT:     Head: Normocephalic and atraumatic.     Right Ear: Tympanic membrane normal.     Left Ear: Tympanic membrane normal.     Mouth/Throat:     Pharynx: Posterior oropharyngeal erythema present.     Comments: No appreciable exudate on exam. Eyes:     General:        Right eye: No discharge.        Left eye: No discharge.     Conjunctiva/sclera: Conjunctivae normal.  Cardiovascular:     Rate and Rhythm: Normal rate and regular rhythm.  Pulmonary:     Effort: Pulmonary effort is normal.     Breath sounds: Normal breath sounds. No wheezing or rales.  Neurological:     Mental Status: She is alert.    Lab Results  Component Value Date   WBC 9.3 05/10/2020   HGB 13.4 05/10/2020   HCT 40.6 05/10/2020   PLT 283 05/10/2020   GLUCOSE 86 05/10/2020    ALT 13 03/29/2018   AST 15 03/29/2018   NA 137 05/10/2020   K 3.7 05/10/2020   CL 103 05/10/2020   CREATININE 0.52 05/10/2020   BUN 9 05/10/2020   CO2 24 05/10/2020   TSH 1.760 03/29/2018     Assessment & Plan:   Problem List Items Addressed This Visit       Respiratory   Strep throat - Primary    Rapid strep positive.  Treating with amoxicillin.       Relevant Orders   POCT rapid strep A (Completed)    Meds ordered this encounter  Medications   amoxicillin (AMOXIL) 875 MG tablet    Sig: Take 1 tablet (875 mg total) by mouth 2 (two) times daily for 10 days.    Dispense:  20 tablet    Refill:  [place]

## 2021-06-30 NOTE — Assessment & Plan Note (Signed)
Rapid strep positive.  Treating with amoxicillin. 

## 2021-07-04 ENCOUNTER — Telehealth: Payer: Medicaid Other | Admitting: Family

## 2021-07-04 DIAGNOSIS — J020 Streptococcal pharyngitis: Secondary | ICD-10-CM

## 2021-07-04 MED ORDER — CEFDINIR 300 MG PO CAPS
300.0000 mg | ORAL_CAPSULE | Freq: Two times a day (BID) | ORAL | 0 refills | Status: DC
Start: 2021-07-04 — End: 2021-10-22

## 2021-07-04 NOTE — Progress Notes (Signed)
Virtual Visit Consent   LAYLIANA Barnett, you are scheduled for a virtual visit with a [facility] provider today. Just as with appointments in the office, your consent must be obtained to participate. Your consent will be active for this visit and any virtual visit you may have with one of our providers in the next 365 days. If you have a MyChart account, a copy of this consent can be sent to you electronically.  As this is a virtual visit, video technology does not allow for your provider to perform a traditional examination. This may limit your provider's ability to fully assess your condition. If your provider identifies any concerns that need to be evaluated in person or the need to arrange testing (such as labs, EKG, etc.), we will make arrangements to do so. Although advances in technology are sophisticated, we cannot ensure that it will always work on either your end or our end. If the connection with a video visit is poor, the visit may have to be switched to a telephone visit. With either a video or telephone visit, we are not always able to ensure that we have a secure connection.  By engaging in this virtual visit, you consent to the provision of healthcare and authorize for your insurance to be billed (if applicable) for the services provided during this visit. Depending on your insurance coverage, you may receive a charge related to this service.  I need to obtain your verbal consent now. Are you willing to proceed with your visit today? QUIANNA AVERY has provided verbal consent on 07/04/2021 for a virtual visit (video or telephone). Evelina Dun, FNP  Date: 07/04/2021 8:40 AM  Virtual Visit via Video Note   I, Evelina Dun, connected with  Tammy Barnett  ([id number], Jun 29, 2000) on 07/04/21 at  8:45 AM EDT by a video-enabled telemedicine application and verified that I am speaking with the correct person using two identifiers.  Location: Patient: Virtual Visit Location  Patient: Home Provider: Virtual Visit Location Provider: Home Office   I discussed the limitations of evaluation and management by telemedicine and the availability of in person appointments. The patient expressed understanding and agreed to proceed.    History of Present Illness: DYANNE Barnett is a 21 y.o. who identifies as a female who was assigned female at birth, and is being seen today for strep. She was diagnosed with strep on 06/30/21 and started on Amoxicillin 875 mg BID. States she feels worse.     HPI: Sore Throat  This is a new problem. The current episode started in the past 7 days. The problem has been gradually worsening. There has been no fever. The pain is at a severity of 9/10. Associated symptoms include congestion, coughing, ear pain, headaches, a hoarse voice, shortness of breath, swollen glands and trouble swallowing. She has tried NSAIDs for the symptoms. The treatment provided mild relief.   Problems:  Patient Active Problem List   Diagnosis Date Noted   Strep throat 06/30/2021   ASCUS with positive high risk HPV cervical 06/28/2021   Sleep disturbance 06/21/2021   Trauma in childhood 11/20/2020   Anxiety 04/11/2017   Thyroid nodule 09/10/2015    Allergies: No Known Allergies Medications:  Current Outpatient Medications:    cefdinir (OMNICEF) 300 MG capsule, Take 1 capsule (300 mg total) by mouth 2 (two) times daily. 1 po BID, Disp: 20 capsule, Rfl: 0   amoxicillin (AMOXIL) 875 MG tablet, Take 1 tablet (875 mg total) by mouth  2 (two) times daily for 10 days., Disp: 20 tablet, Rfl: 0   fexofenadine (ALLEGRA ALLERGY) 180 MG tablet, Take 1 tablet (180 mg total) by mouth daily for 15 days., Disp: 15 tablet, Rfl: 0   Norethindrone Acetate-Ethinyl Estradiol (LOESTRIN) 1.5-30 MG-MCG tablet, Take 1 tablet by mouth daily., Disp: 84 tablet, Rfl: 4   traZODone (DESYREL) 50 MG tablet, Take 1 tablet (50 mg total) by mouth at bedtime as needed for sleep., Disp: 30 tablet,  Rfl: 1  Observations/Objective: Patient is well-developed, well-nourished in no acute distress.  Resting comfortably at home.  Head is normocephalic, atraumatic.  No labored breathing.  Speech is clear and coherent with logical content.  Patient is alert and oriented at baseline.  Erythemas tonsils, hoarse voice   Assessment and Plan: 1. Strep throat - cefdinir (OMNICEF) 300 MG capsule; Take 1 capsule (300 mg total) by mouth 2 (two) times daily. 1 po BID  Dispense: 20 capsule; Refill: 0  Stop Amoxicillin  - Take meds as prescribed - Use a cool mist humidifier  -Use saline nose sprays frequently -Force fluids -For fever or aces or pains- take tylenol or ibuprofen. -Throat lozenges if help -New toothbrush in 3 days  Follow Up Instructions: I discussed the assessment and treatment plan with the patient. The patient was provided an opportunity to ask questions and all were answered. The patient agreed with the plan and demonstrated an understanding of the instructions.  A copy of instructions were sent to the patient via MyChart unless otherwise noted below.     The patient was advised to call back or seek an in-person evaluation if the symptoms worsen or if the condition fails to improve as anticipated.  Time:  I spent 8 minutes with the patient via telehealth technology discussing the above problems/concerns.    Evelina Dun, FNP

## 2021-07-05 ENCOUNTER — Encounter (HOSPITAL_COMMUNITY): Payer: Self-pay

## 2021-07-05 ENCOUNTER — Emergency Department (HOSPITAL_COMMUNITY): Payer: Medicaid Other

## 2021-07-05 ENCOUNTER — Emergency Department (HOSPITAL_COMMUNITY)
Admission: EM | Admit: 2021-07-05 | Discharge: 2021-07-05 | Disposition: A | Discharge status: Home / Self Care | Payer: Medicaid Other | Attending: Emergency Medicine | Admitting: Emergency Medicine

## 2021-07-05 ENCOUNTER — Other Ambulatory Visit: Payer: Self-pay

## 2021-07-05 DIAGNOSIS — J029 Acute pharyngitis, unspecified: Secondary | ICD-10-CM | POA: Diagnosis present

## 2021-07-05 DIAGNOSIS — L0100 Impetigo, unspecified: Secondary | ICD-10-CM | POA: Insufficient documentation

## 2021-07-05 DIAGNOSIS — R059 Cough, unspecified: Secondary | ICD-10-CM | POA: Diagnosis not present

## 2021-07-05 DIAGNOSIS — J069 Acute upper respiratory infection, unspecified: Secondary | ICD-10-CM | POA: Diagnosis not present

## 2021-07-05 DIAGNOSIS — B9789 Other viral agents as the cause of diseases classified elsewhere: Secondary | ICD-10-CM | POA: Diagnosis not present

## 2021-07-05 DIAGNOSIS — Z20822 Contact with and (suspected) exposure to covid-19: Secondary | ICD-10-CM | POA: Diagnosis not present

## 2021-07-05 LAB — RESP PANEL BY RT-PCR (FLU A&B, COVID) ARPGX2
Influenza A by PCR: NEGATIVE
Influenza B by PCR: NEGATIVE
SARS Coronavirus 2 by RT PCR: NEGATIVE

## 2021-07-05 MED ORDER — LIDOCAINE VISCOUS HCL 2 % MT SOLN
15.0000 mL | Freq: Once | OROMUCOSAL | Status: AC
Start: 2021-07-05 — End: 2021-07-05
  Administered 2021-07-05: 15 mL via OROMUCOSAL
  Filled 2021-07-05: qty 15

## 2021-07-05 MED ORDER — BENZONATATE 200 MG PO CAPS: 200.0000 mg | ORAL_CAPSULE | Freq: Three times a day (TID) | ORAL | 0 refills | Status: DC | PRN

## 2021-07-05 MED ORDER — LIDOCAINE VISCOUS HCL 2 % MT SOLN: 15.0000 mL | Freq: Four times a day (QID) | OROMUCOSAL | 0 refills | Status: DC | PRN

## 2021-07-05 MED ORDER — MUPIROCIN CALCIUM 2 % EX CREA
TOPICAL_CREAM | Freq: Once | CUTANEOUS | Status: AC
  Administered 2021-07-05: 1 via TOPICAL
  Filled 2021-07-05: qty 15

## 2021-07-05 MED ORDER — IBUPROFEN 400 MG PO TABS
600.0000 mg | ORAL_TABLET | Freq: Once | ORAL | Status: AC
  Administered 2021-07-05: 600 mg via ORAL
  Filled 2021-07-05: qty 2

## 2021-07-05 MED ORDER — BENZONATATE 100 MG PO CAPS
200.0000 mg | ORAL_CAPSULE | Freq: Once | ORAL | Status: AC
Start: 2021-07-05 — End: 2021-07-05
  Administered 2021-07-05: 200 mg via ORAL
  Filled 2021-07-05: qty 2

## 2021-07-05 NOTE — ED Notes (Signed)
PA speaking with pt. 

## 2021-07-05 NOTE — ED Provider Notes (Signed)
[facility] Provider Note   CSN: [id number] Arrival date & time: 07/05/21  0865     History  Chief Complaint  Patient presents with   Sore Throat    Tammy Barnett is a 21 y.o. female.  Tammy Barnett is a 21 y.o. female with history of ovarian cyst, anxiety and depression, who presents to the emergency department for evaluation of persistent sore throat, congestion and cough.  Patient was seen initially on 5/24 and diagnosed with strep throat, treated with amoxicillin 875 twice daily.  Despite taking antibiotics patient feels that she is continue to worsen.  She reports that initially her primary symptom was sore throat but now she is experiencing more congestion, rhinorrhea and productive cough.  She also reports some headaches and body aches.  She reports that she lost her voice for a day or 2 and now her voice just sounds hoarse.  She has been taking ibuprofen and Tylenol with mild relief in symptoms.  Had a urgent care video visit yesterday where they told her to discontinue amoxicillin and begin taking cefdinir but she woke up today feeling even worse.  No known other sick contacts.  No associated chest pain, with coughing she occasionally feels short of breath and as though she is wheezing.  No abdominal pain, nausea, vomiting or diarrhea.  Patient also reports a scab-like rash that is developed over her nose, she has not noted this elsewhere.  The history is provided by the patient and a parent.      Home Medications Prior to Admission medications   Medication Sig Start Date End Date Taking? Authorizing Provider  amoxicillin (AMOXIL) 875 MG tablet Take 1 tablet (875 mg total) by mouth 2 (two) times daily for 10 days. 06/30/21 07/10/21 Yes Cook, Jayce G, DO  benzonatate (TESSALON) 200 MG capsule Take 1 capsule (200 mg total) by mouth 3 (three) times daily as needed for cough. 07/05/21  Yes Jacqlyn Larsen, PA-C  cefdinir (OMNICEF) 300 MG capsule Take 1  capsule (300 mg total) by mouth 2 (two) times daily. 1 po BID 07/04/21  Yes Hawks, Christy A, FNP  lidocaine (XYLOCAINE) 2 % solution Use as directed 15 mLs in the mouth or throat every 6 (six) hours as needed (Sore throat). 07/05/21  Yes Jacqlyn Larsen, PA-C  Norethindrone Acetate-Ethinyl Estradiol (LOESTRIN) 1.5-30 MG-MCG tablet Take 1 tablet by mouth daily. 06/21/21  Yes Estill Dooms, NP  traZODone (DESYREL) 50 MG tablet Take 1 tablet (50 mg total) by mouth at bedtime as needed for sleep. 06/21/21  Yes Estill Dooms, NP  fexofenadine (ALLEGRA ALLERGY) 180 MG tablet Take 1 tablet (180 mg total) by mouth daily for 15 days. 01/18/21 06/30/21  Eliezer Lofts, FNP      Allergies    Patient has no known allergies.    Review of Systems   Review of Systems  Constitutional:  Negative for chills and fever.  HENT:  Positive for congestion, rhinorrhea and sore throat.   Respiratory:  Positive for cough. Negative for shortness of breath.   Cardiovascular:  Negative for chest pain.  Gastrointestinal:  Negative for abdominal pain, diarrhea, nausea and vomiting.  Musculoskeletal:  Negative for myalgias, neck pain and neck stiffness.  Skin:  Negative for color change and rash.  Neurological:  Positive for headaches.  All other systems reviewed and are negative.  Physical Exam Updated Vital Signs BP (!) 147/78   Pulse 88   Temp 98.5 F (36.9 C)  Resp 16   Ht '5\' 5"'$  (1.651 m)   Wt 85.3 kg   LMP  (LMP Unknown)   SpO2 100%   BMI 31.28 kg/m  Physical Exam Vitals and nursing note reviewed.  Constitutional:      General: She is not in acute distress.    Appearance: She is well-developed. She is not ill-appearing or diaphoretic.  HENT:     Head: Normocephalic and atraumatic.     Right Ear: Tympanic membrane and ear canal normal.     Left Ear: Tympanic membrane and ear canal normal.     Nose: Congestion and rhinorrhea present.     Mouth/Throat:     Mouth: Mucous membranes are moist.      Comments: Posterior oropharynx is erythematous with a few white ulcerations present, mild tonsillar swelling, uvula midline, no evidence of peritonsillar abscess, tolerating secretions, voice is slightly hoarse Eyes:     General:        Right eye: No discharge.        Left eye: No discharge.  Neck:     Comments: No lymphadenopathy, no tenderness, no rigidity Cardiovascular:     Rate and Rhythm: Normal rate and regular rhythm.     Heart sounds: Normal heart sounds. No murmur heard.   No friction rub. No gallop.  Pulmonary:     Effort: Pulmonary effort is normal. No respiratory distress.     Breath sounds: Normal breath sounds.     Comments: Respirations equal and unlabored, patient able to speak in full sentences, lungs clear to auscultation bilaterally, intermittent coughing during exam Abdominal:     General: Bowel sounds are normal. There is no distension.     Palpations: Abdomen is soft. There is no mass.     Tenderness: There is no abdominal tenderness. There is no guarding.     Comments: Abdomen soft, nondistended, nontender to palpation in all quadrants without guarding or peritoneal signs  Musculoskeletal:        General: No deformity.     Cervical back: Neck supple.  Lymphadenopathy:     Cervical: No cervical adenopathy.  Skin:    General: Skin is warm and dry.     Capillary Refill: Capillary refill takes less than 2 seconds.  Neurological:     Mental Status: She is alert and oriented to person, place, and time.  Psychiatric:        Mood and Affect: Mood normal.        Behavior: Behavior normal.    ED Results / Procedures / Treatments   Labs (all labs ordered are listed, but only abnormal results are displayed) Labs Reviewed  RESP PANEL BY RT-PCR (FLU A&B, COVID) [id number]    EKG None  Radiology DG Chest 2 View  Result Date: 07/05/2021 CLINICAL DATA:  Cough. EXAM: CHEST - 2 VIEW COMPARISON:  Chest radiograph 01/18/2021. FINDINGS: No consolidation. No visible  pleural effusions or pneumothorax. Cardiomediastinal silhouette is within normal limits. No acute osseous abnormality. IMPRESSION: No evidence of acute cardiopulmonary disease. Electronically Signed   By: Margaretha Sheffield M.D.   On: 07/05/2021 10:17    Procedures Procedures    Medications Ordered in ED Medications  ibuprofen (ADVIL) tablet 600 mg (600 mg Oral Given 07/05/21 1033)  benzonatate (TESSALON) capsule 200 mg (200 mg Oral Given 07/05/21 1034)  lidocaine (XYLOCAINE) 2 % viscous mouth solution 15 mL (15 mLs Mouth/Throat Given 07/05/21 1035)  mupirocin cream (BACTROBAN) 2 % (1 application. Topical Given 07/05/21 1035)  ED Course/ Medical Decision Making/ A&P                           Medical Decision Making Amount and/or Complexity of Data Reviewed Radiology: ordered.  Risk Prescription drug management.   21 y.o. female presents to the ED with complaints of persistent sore throat as well as congestion and cough, this involves an extensive number of treatment options, and is a complaint that carries with it a high risk of complications and morbidity.  The differential diagnosis includes viral respiratory infection, flu, COVID, pharyngitis, peritonsillar abscess, mono   On arrival pt is nontoxic, vitals significant for mildly elevated blood pressure but otherwise unremarkable, afebrile, satting well on room air. Exam significant for posterior oropharynx with mild erythema and a few small ulcerations but no signs of peritonsillar abscess, patient tolerating secretions without difficulty, lungs are clear.  No lymphadenopathy or rigidity of the neck.  Additional history obtained from mom at bedside. Previous records obtained and reviewed including notes from recent urgent care encounter  I ordered medications including ibuprofen, viscous lidocaine and Tessalon for cough and sore throat  Lab Tests:  I Ordered, reviewed, and interpreted labs, which included: COVID and flu testing  which are pending at discharge  Imaging Studies ordered:  I ordered imaging studies which included chest x-ray, I independently visualized and interpreted imaging which showed no evidence of pneumonia  ED Course:   Discussed with patient and mom at bedside that I suspect in addition to recent strep infection she likely has a viral upper respiratory infection given worsening of cough and congestion.  No concern for peritonsillar abscess on exam.  Patient tolerating p.o. food and fluids.  No evidence of pneumonia.  Discussed continued supportive treatment and will have patient complete course of antibiotics for strep throat as prescribed by urgent care.  Discussed outpatient follow-up and return precautions.  Discharged home in good condition.    Portions of this note were generated with [employer]. Dictation errors may occur despite best attempts at proofreading.         Final Clinical Impression(s) / ED Diagnoses Final diagnoses:  Viral URI with cough  Impetigo    Rx / DC Orders ED Discharge Orders          Ordered    benzonatate (TESSALON) 200 MG capsule  3 times daily PRN        07/05/21 1202    lidocaine (XYLOCAINE) 2 % solution  Every 6 hours PRN        07/05/21 1202              Janet Berlin 07/05/21 1217    Noemi Chapel, MD 07/06/21 [phone]

## 2021-07-05 NOTE — ED Triage Notes (Signed)
Pt diagnosed with strep throat Weds and given Amoxicillin and then changed it to a different one yesterday because she was not getting better. Pt states she feels worse and not improving.

## 2021-07-05 NOTE — ED Notes (Signed)
Pt reports nasal congestion, coughing since last Wednesday

## 2021-07-05 NOTE — Discharge Instructions (Addendum)
I suspect in addition to your strep throat fracture you likely have a viral upper respiratory.  Your COVID and flu test are pending and you will be called by phone if test results are positive, you can also get results online through [facility].  Please complete the antibiotic that were prescribed for strep throat.  For the area of impetigo on your nose you mupirocin ointment twice daily.  To help support your symptoms you can use ibuprofen 600 mg and Tylenol 1000 mg every 6 hours as needed for pain or fever.  You can use prescribed cough medication 3 times daily as needed.  You can also continue to use other over-the-counter medications such as Sudafed to help with congestion and runny nose.  If your symptoms or not improving over the next 4 to 5 days please follow-up closely with your primary care doctor.  If you develop persistent fevers, chest pain, difficulty breathing, vomiting, difficulty swallowing or tolerating your saliva please return for reevaluation.

## 2021-07-06 ENCOUNTER — Telehealth: Payer: Self-pay

## 2021-07-06 NOTE — Telephone Encounter (Signed)
Transition Care Management Follow-up Telephone Call Date of discharge and from where: 07/05/2021-[facility]  How have you been since you were released from the hospital? Pt stated she is feeling a little better. She has medications from the pharmacy. Any questions or concerns? No  Items Reviewed: Did the pt receive and understand the discharge instructions provided? Yes  Medications obtained and verified? Yes  Other? No  Any new allergies since your discharge? No  Dietary orders reviewed? No Do you have support at home? Yes   Home Care and Equipment/Supplies: Were home health services ordered? not applicable If so, what is the name of the agency? N/A  Has the agency set up a time to come to the patient's home? not applicable Were any new equipment or medical supplies ordered?  No What is the name of the medical supply agency? N/A Were you able to get the supplies/equipment? not applicable Do you have any questions related to the use of the equipment or supplies? No  Functional Questionnaire: (I = Independent and D = Dependent) ADLs: I  Bathing/Dressing- I  Meal Prep- I  Eating- I  Maintaining continence- I  Transferring/Ambulation- I  Managing Meds- I  Follow up appointments reviewed:  PCP Hospital f/u appt confirmed? No   Specialist Hospital f/u appt confirmed? No   Are transportation arrangements needed? No  If their condition worsens, is the pt aware to call PCP or go to the Emergency Dept.? Yes Was the patient provided with contact information for the PCP's office or ED? Yes Was to pt encouraged to call back with questions or concerns? Yes

## 2021-10-22 ENCOUNTER — Encounter: Payer: Self-pay | Admitting: Adult Health

## 2021-10-22 ENCOUNTER — Ambulatory Visit (INDEPENDENT_AMBULATORY_CARE_PROVIDER_SITE_OTHER): Payer: BC Managed Care – PPO | Admitting: Adult Health

## 2021-10-22 VITALS — BP 133/76 | HR 81 | Ht 65.0 in | Wt 193.5 lb

## 2021-10-22 DIAGNOSIS — R635 Abnormal weight gain: Secondary | ICD-10-CM | POA: Diagnosis not present

## 2021-10-22 DIAGNOSIS — Z3041 Encounter for surveillance of contraceptive pills: Secondary | ICD-10-CM | POA: Insufficient documentation

## 2021-10-22 DIAGNOSIS — Z3202 Encounter for pregnancy test, result negative: Secondary | ICD-10-CM | POA: Insufficient documentation

## 2021-10-22 LAB — POCT URINE PREGNANCY: Preg Test, Ur: NEGATIVE

## 2021-10-22 MED ORDER — NORETHINDRONE ACET-ETHINYL EST 1-20 MG-MCG PO TABS: 1.0000 | ORAL_TABLET | Freq: Every day | ORAL | 4 refills | Status: DC

## 2021-10-22 NOTE — Progress Notes (Signed)
  Subjective:     Patient ID: Tammy [facility], female   DOB: 07/24/2000, 21 y.o.   MRN: [id number]  HPI Rubylee is a 21 year old white female,single, G0P0, in complaining of weight gain with Loestrin 1.5-30,wants to change pills.  Last pap was 06/21/21 ASCUS +HPV, will repeat in 1 year  PCP is Dr Sallee Lange.  Review of Systems +weight gain in OCs she says  Reviewed past medical,surgical, social and family history. Reviewed medications and allergies.     Objective:   Physical Exam BP 133/76 (BP Location: Right Arm, Patient Position: Sitting, Cuff Size: Normal)   Pulse 81   Ht '5\' 5"'$  (1.651 m)   Wt 193 lb 8 oz (87.8 kg)   BMI 32.20 kg/m  has gained 7 lbs since May, for about 20 lbs in last year.  UPT is negative    Skin warm and dry.Lungs: clear to ausculation bilaterally. Cardiovascular: regular rate and rhythm.   Upstream - 10/22/21 1213       Pregnancy Intention Screening   Does the patient want to become pregnant in the next year? No    Does the patient's partner want to become pregnant in the next year? No    Would the patient like to discuss contraceptive options today? Yes      Contraception Wrap Up   Current Method Oral Contraceptive    End Method Oral Contraceptive    Contraception Counseling Provided Yes             Assessment:     1. Pregnancy examination or test, negative result  2. Encounter for surveillance of contraceptive pills Finish current pack of pills, and start Junel 1-20, use condoms for 1 pack Meds ordered this encounter  Medications   norethindrone-ethinyl estradiol (LOESTRIN) 1-20 MG-MCG tablet    Sig: Take 1 tablet by mouth daily.    Dispense:  84 tablet    Refill:  4    Order Specific Question:   Supervising Provider    Answer:   Elonda Husky, LUTHER H [[id number]]     3. Weight gain Will change OCs    Plan:     Follow up in 3 months for ROS

## 2021-11-11 ENCOUNTER — Other Ambulatory Visit: Payer: Self-pay | Admitting: Adult Health

## 2021-11-12 ENCOUNTER — Other Ambulatory Visit: Payer: Self-pay | Admitting: Adult Health

## 2021-11-12 MED ORDER — NORETHINDRONE ACET-ETHINYL EST 1-20 MG-MCG PO TABS: 1.0000 | ORAL_TABLET | Freq: Every day | ORAL | 4 refills | Status: DC

## 2021-11-12 NOTE — Progress Notes (Signed)
Resent for JUnel 1/20

## 2021-12-13 ENCOUNTER — Ambulatory Visit (INDEPENDENT_AMBULATORY_CARE_PROVIDER_SITE_OTHER): Payer: BC Managed Care – PPO | Admitting: Nurse Practitioner

## 2021-12-13 VITALS — BP 120/76 | Ht 65.0 in | Wt 196.8 lb

## 2021-12-13 DIAGNOSIS — K5900 Constipation, unspecified: Secondary | ICD-10-CM | POA: Diagnosis not present

## 2021-12-13 DIAGNOSIS — R140 Abdominal distension (gaseous): Secondary | ICD-10-CM

## 2021-12-13 MED ORDER — POLYETHYLENE GLYCOL 3350 17 GM/SCOOP PO POWD: 17.0000 g | Freq: Two times a day (BID) | ORAL | 1 refills | Status: DC | PRN

## 2021-12-13 NOTE — Progress Notes (Unsigned)
   Subjective:    Patient ID: Tammy [facility], female    DOB: 2000/11/08, 21 y.o.   MRN: [id number]  HPI Patient arrives to discuss problems with feeling bloated for over 6 months. Patient states he stomach has gotten large and feels hard.   Review of Systems     Objective:   Physical Exam        Assessment & Plan:

## 2021-12-14 LAB — CBC WITH DIFFERENTIAL/PLATELET
Basophils Absolute: 0 10*3/uL (ref 0.0–0.2)
Basos: 1 %
EOS (ABSOLUTE): 0.1 10*3/uL (ref 0.0–0.4)
Eos: 1 %
Hematocrit: 40.6 % (ref 34.0–46.6)
Hemoglobin: 13.5 g/dL (ref 11.1–15.9)
Immature Grans (Abs): 0 10*3/uL (ref 0.0–0.1)
Immature Granulocytes: 0 %
Lymphocytes Absolute: 1.7 10*3/uL (ref 0.7–3.1)
Lymphs: 25 %
MCH: 28.7 pg (ref 26.6–33.0)
MCHC: 33.3 g/dL (ref 31.5–35.7)
MCV: 86 fL (ref 79–97)
Monocytes Absolute: 0.5 10*3/uL (ref 0.1–0.9)
Monocytes: 7 %
Neutrophils Absolute: 4.6 10*3/uL (ref 1.4–7.0)
Neutrophils: 66 %
Platelets: 328 10*3/uL (ref 150–450)
RBC: 4.7 x10E6/uL (ref 3.77–5.28)
RDW: 12.9 % (ref 11.7–15.4)
WBC: 6.9 10*3/uL (ref 3.4–10.8)

## 2021-12-14 LAB — CMP14+EGFR
ALT: 13 IU/L (ref 0–32)
AST: 16 IU/L (ref 0–40)
Albumin/Globulin Ratio: 1.7 (ref 1.2–2.2)
Albumin: 4.6 g/dL (ref 4.0–5.0)
Alkaline Phosphatase: 103 IU/L (ref 44–121)
BUN/Creatinine Ratio: 16 (ref 9–23)
BUN: 9 mg/dL (ref 6–20)
Bilirubin Total: 0.3 mg/dL (ref 0.0–1.2)
CO2: 23 mmol/L (ref 20–29)
Calcium: 9.3 mg/dL (ref 8.7–10.2)
Chloride: 103 mmol/L (ref 96–106)
Creatinine, Ser: 0.57 mg/dL (ref 0.57–1.00)
Globulin, Total: 2.7 g/dL (ref 1.5–4.5)
Glucose: 83 mg/dL (ref 70–99)
Potassium: 4.3 mmol/L (ref 3.5–5.2)
Sodium: 141 mmol/L (ref 134–144)
Total Protein: 7.3 g/dL (ref 6.0–8.5)
eGFR: 133 mL/min/{1.73_m2} (ref >59)

## 2021-12-14 LAB — LIPASE: Lipase: 29 U/L (ref 14–72)

## 2021-12-14 LAB — TSH+FREE T4
Free T4: 1.1 ng/dL (ref 0.82–1.77)
TSH: 1.6 u[IU]/mL (ref 0.450–4.500)

## 2021-12-14 LAB — AMYLASE: Amylase: 45 U/L (ref 31–110)

## 2021-12-15 ENCOUNTER — Encounter: Payer: Self-pay | Admitting: Nurse Practitioner

## 2022-01-05 ENCOUNTER — Encounter: Payer: Self-pay | Admitting: Nurse Practitioner

## 2022-01-05 ENCOUNTER — Ambulatory Visit (INDEPENDENT_AMBULATORY_CARE_PROVIDER_SITE_OTHER): Payer: BC Managed Care – PPO | Admitting: Nurse Practitioner

## 2022-01-05 VITALS — BP 127/84 | HR 92 | Wt 200.6 lb

## 2022-01-05 DIAGNOSIS — K5900 Constipation, unspecified: Secondary | ICD-10-CM

## 2022-01-05 DIAGNOSIS — T1490XA Injury, unspecified, initial encounter: Secondary | ICD-10-CM

## 2022-01-05 NOTE — Progress Notes (Signed)
   Subjective:    Patient ID: Tammy [facility], female    DOB: December 06, 2000, 21 y.o.   MRN: [id number]  HPI  21 year old patient arrive for follow up on constipation. Pt states she has seen a difference with taking the Miralax daily. She feels less bloated. Pt states if she does not take the Miralax she can tell.   Patient does admit to having a traumatic childhood and believes she may have some IBS symptoms. Patient states that she was on Lexapro previously, but did not like the way it made her feel. Patient does admit to being under a lot of stress since she feels the need to take care of her younger siblings.   Patient denies any thoughts of wanting to hurt self or anyone else.    Review of Systems  All other systems reviewed and are negative.      Objective:   Physical Exam Constitutional:      General: She is not in acute distress.    Appearance: Normal appearance. She is obese. She is not ill-appearing, toxic-appearing or diaphoretic.  HENT:     Head: Normocephalic and atraumatic.  Neurological:     Mental Status: She is alert.  Psychiatric:        Mood and Affect: Mood normal.        Behavior: Behavior normal.           Assessment & Plan:    1. Constipation, unspecified constipation type - Appears to be stable with use of Miralax. - Continue with Miralax. Also incorporate more fiber in your diet (such as oatmeal and leafy greens) - If Miralax beings to not work, may consider Linzess for IBS-C - RTC as needed.   2. 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 Also explained link between anxiety and IBS.  -RTC as needed.

## 2022-01-10 ENCOUNTER — Other Ambulatory Visit (INDEPENDENT_AMBULATORY_CARE_PROVIDER_SITE_OTHER): Payer: BC Managed Care – PPO

## 2022-01-10 ENCOUNTER — Other Ambulatory Visit (HOSPITAL_COMMUNITY)
Admission: RE | Admit: 2022-01-10 | Discharge: 2022-01-10 | Disposition: A | Discharge status: Home / Self Care | Payer: BC Managed Care – PPO | Source: Ambulatory Visit | Attending: Obstetrics & Gynecology | Admitting: Obstetrics & Gynecology

## 2022-01-10 DIAGNOSIS — N898 Other specified noninflammatory disorders of vagina: Secondary | ICD-10-CM | POA: Insufficient documentation

## 2022-01-10 NOTE — Progress Notes (Signed)
   NURSE VISIT- VAGINITIS/STD  SUBJECTIVE:  Tammy [facility] is a 21 y.o. G0P0000 GYN patientfemale here for a vaginal swab for vaginitis screening.  She reports the following symptoms: discharge described as white and thick and local irritation since last week. Denies abnormal vaginal bleeding, significant pelvic pain, fever, or UTI symptoms.  OBJECTIVE:  There were no vitals taken for this visit.  Appears well, in no apparent distress  ASSESSMENT: Vaginal swab for vaginitis screening  PLAN: Self-collected vaginal probe for Gonorrhea, Chlamydia, Trichomonas, Bacterial Vaginosis, Yeast sent to lab Treatment: to be determined once results are received Follow-up as needed if symptoms persist/worsen, or new symptoms develop  [name]  01/10/2022 10:46 AM

## 2022-01-11 LAB — CERVICOVAGINAL ANCILLARY ONLY
Bacterial Vaginitis (gardnerella): NEGATIVE
Candida Glabrata: NEGATIVE
Candida Vaginitis: NEGATIVE
Chlamydia: NEGATIVE
Comment: NEGATIVE
Comment: NEGATIVE
Comment: NEGATIVE
Comment: NEGATIVE
Comment: NEGATIVE
Comment: NORMAL
Neisseria Gonorrhea: NEGATIVE
Trichomonas: NEGATIVE

## 2022-01-19 DIAGNOSIS — F4320 Adjustment disorder, unspecified: Secondary | ICD-10-CM | POA: Diagnosis not present

## 2022-01-21 ENCOUNTER — Ambulatory Visit: Payer: BC Managed Care – PPO | Admitting: Adult Health

## 2022-01-26 DIAGNOSIS — F4320 Adjustment disorder, unspecified: Secondary | ICD-10-CM | POA: Diagnosis not present

## 2022-02-02 DIAGNOSIS — F4320 Adjustment disorder, unspecified: Secondary | ICD-10-CM | POA: Diagnosis not present

## 2022-02-09 DIAGNOSIS — F4320 Adjustment disorder, unspecified: Secondary | ICD-10-CM | POA: Diagnosis not present

## 2022-02-16 DIAGNOSIS — F4320 Adjustment disorder, unspecified: Secondary | ICD-10-CM | POA: Diagnosis not present

## 2022-02-24 DIAGNOSIS — F4320 Adjustment disorder, unspecified: Secondary | ICD-10-CM | POA: Diagnosis not present

## 2022-03-15 ENCOUNTER — Ambulatory Visit (INDEPENDENT_AMBULATORY_CARE_PROVIDER_SITE_OTHER): Payer: BC Managed Care – PPO | Admitting: Family Medicine

## 2022-03-15 ENCOUNTER — Encounter: Payer: Self-pay | Admitting: Family Medicine

## 2022-03-15 VITALS — BP 118/70 | Wt 209.2 lb

## 2022-03-15 DIAGNOSIS — G5601 Carpal tunnel syndrome, right upper limb: Secondary | ICD-10-CM | POA: Diagnosis not present

## 2022-03-15 DIAGNOSIS — M7918 Myalgia, other site: Secondary | ICD-10-CM | POA: Diagnosis not present

## 2022-03-15 DIAGNOSIS — F411 Generalized anxiety disorder: Secondary | ICD-10-CM | POA: Diagnosis not present

## 2022-03-15 MED ORDER — DICLOFENAC POTASSIUM 50 MG PO TABS: ORAL_TABLET | ORAL | 1 refills | Status: DC

## 2022-03-15 MED ORDER — VALACYCLOVIR HCL 1 G PO TABS: ORAL_TABLET | ORAL | 2 refills | Status: DC

## 2022-03-15 NOTE — Progress Notes (Signed)
   Subjective:    Patient ID: [facility]my [facility], female    DOB: 2001-01-24, 22 y.o.   MRN: [id number]  HPI Pt arrives due to pain in left side of rib area that shoots straight up to left breast. No breast pain. Cramps become intense at times. 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 Neck pain when turning to far to one side.  Does have intermittent numbness into the right hand sometimes wakes up with a hand asleep Patient denies breast pain  Review of Systems     Objective:   Physical Exam General-in no acute distress Eyes-no discharge Lungs-respiratory rate normal, CTA CV-no murmurs,RRR Extremities skin warm dry no edema Neuro grossly normal Behavior normal, alert Abdomen is soft nurse present for exam left side rib cage nontender Subjective slight discomfort upper back neck region      Assessment & Plan:  Cervical stretches Core exercises and stretches Musculoskeletal pain Anti-inflammatory twice daily as needed not for frequent use If ongoing troubles follow-up Patient with carpal tunnel syndrome To do wrist brace If persistent problems or ongoing troubles let us know we will help set up with hand specialist for nerve conduction study  She continues to have trouble we will do x-rays of the chest as well as set up with physical medicine specialist

## 2022-03-29 ENCOUNTER — Telehealth: Payer: Self-pay

## 2022-03-29 ENCOUNTER — Encounter: Payer: Self-pay | Admitting: Family Medicine

## 2022-03-29 MED ORDER — MUPIROCIN CALCIUM 2 % EX CREA: TOPICAL_CREAM | CUTANEOUS | 1 refills | Status: DC

## 2022-03-29 NOTE — Telephone Encounter (Signed)
Patient states has empetigo on her nose again, previously had it back in the summer and was prescribed a cream/ointment unsure of name. Please advise

## 2022-03-29 NOTE — Telephone Encounter (Signed)
Bactroban ointment apply thin amount twice daily, 22 g tube, 1 refill

## 2022-03-29 NOTE — Telephone Encounter (Signed)
Nurses-I was able to also review over her pictures-previously today we sent in [place] but given her picture I would also recommend doxycycline 100 mg 1 taken twice daily for 5 days-please take this with tall glass of water and a small snack  If her issue does not clear up I would recommend a follow-up office visit

## 2022-03-29 NOTE — Telephone Encounter (Signed)
Medication sent to pharmacy. My chart message sent to patient.

## 2022-03-30 DIAGNOSIS — F411 Generalized anxiety disorder: Secondary | ICD-10-CM | POA: Diagnosis not present

## 2022-03-30 MED ORDER — DOXYCYCLINE HYCLATE 100 MG PO TABS: ORAL_TABLET | ORAL | 0 refills | Status: DC

## 2022-04-05 DIAGNOSIS — F411 Generalized anxiety disorder: Secondary | ICD-10-CM | POA: Diagnosis not present

## 2022-04-12 DIAGNOSIS — F411 Generalized anxiety disorder: Secondary | ICD-10-CM | POA: Diagnosis not present

## 2022-04-19 DIAGNOSIS — F411 Generalized anxiety disorder: Secondary | ICD-10-CM | POA: Diagnosis not present

## 2022-04-26 DIAGNOSIS — F411 Generalized anxiety disorder: Secondary | ICD-10-CM | POA: Diagnosis not present

## 2022-04-27 ENCOUNTER — Encounter: Payer: Self-pay | Admitting: Obstetrics and Gynecology

## 2022-04-27 ENCOUNTER — Ambulatory Visit (INDEPENDENT_AMBULATORY_CARE_PROVIDER_SITE_OTHER): Payer: BC Managed Care – PPO | Admitting: Obstetrics and Gynecology

## 2022-04-27 VITALS — BP 124/67 | HR 81 | Ht 65.0 in | Wt 200.2 lb

## 2022-04-27 DIAGNOSIS — L0292 Furuncle, unspecified: Secondary | ICD-10-CM | POA: Diagnosis not present

## 2022-04-27 MED ORDER — CEPHALEXIN 500 MG PO CAPS: 500.0000 mg | ORAL_CAPSULE | Freq: Three times a day (TID) | ORAL | 0 refills | Status: DC

## 2022-04-27 NOTE — Progress Notes (Signed)
Tammy Barnett presents with a boil on her left labia. Noted Sunday Spontaneous rupture this morning  PE AF VSS Chaperone present  Lungs clear Heart RRR Abd soft + BS GU, 1 cm left labial boil, able to express min purulent discharge  A/P Labial boil  Keflex x 7 days, Warm compresses and Sitz baths PRN F/U PRN

## 2022-05-03 DIAGNOSIS — F411 Generalized anxiety disorder: Secondary | ICD-10-CM | POA: Diagnosis not present

## 2022-05-10 DIAGNOSIS — F411 Generalized anxiety disorder: Secondary | ICD-10-CM | POA: Diagnosis not present

## 2022-05-17 DIAGNOSIS — F411 Generalized anxiety disorder: Secondary | ICD-10-CM | POA: Diagnosis not present

## 2022-05-24 DIAGNOSIS — F411 Generalized anxiety disorder: Secondary | ICD-10-CM | POA: Diagnosis not present

## 2022-05-31 DIAGNOSIS — F411 Generalized anxiety disorder: Secondary | ICD-10-CM | POA: Diagnosis not present

## 2022-06-07 DIAGNOSIS — F411 Generalized anxiety disorder: Secondary | ICD-10-CM | POA: Diagnosis not present

## 2022-06-14 DIAGNOSIS — F411 Generalized anxiety disorder: Secondary | ICD-10-CM | POA: Diagnosis not present

## 2022-06-28 DIAGNOSIS — F411 Generalized anxiety disorder: Secondary | ICD-10-CM | POA: Diagnosis not present

## 2022-07-05 DIAGNOSIS — F411 Generalized anxiety disorder: Secondary | ICD-10-CM | POA: Diagnosis not present

## 2022-07-21 DIAGNOSIS — F411 Generalized anxiety disorder: Secondary | ICD-10-CM | POA: Diagnosis not present

## 2022-07-26 DIAGNOSIS — F411 Generalized anxiety disorder: Secondary | ICD-10-CM | POA: Diagnosis not present

## 2022-08-02 DIAGNOSIS — F411 Generalized anxiety disorder: Secondary | ICD-10-CM | POA: Diagnosis not present

## 2022-08-23 DIAGNOSIS — F411 Generalized anxiety disorder: Secondary | ICD-10-CM | POA: Diagnosis not present

## 2022-08-30 DIAGNOSIS — F411 Generalized anxiety disorder: Secondary | ICD-10-CM | POA: Diagnosis not present

## 2022-09-01 ENCOUNTER — Ambulatory Visit (INDEPENDENT_AMBULATORY_CARE_PROVIDER_SITE_OTHER): Payer: BC Managed Care – PPO | Admitting: Obstetrics & Gynecology

## 2022-09-01 ENCOUNTER — Other Ambulatory Visit (HOSPITAL_COMMUNITY)
Admission: RE | Admit: 2022-09-01 | Discharge: 2022-09-01 | Disposition: A | Discharge status: Home / Self Care | Payer: BC Managed Care – PPO | Source: Ambulatory Visit | Attending: Obstetrics & Gynecology | Admitting: Obstetrics & Gynecology

## 2022-09-01 ENCOUNTER — Encounter: Payer: Self-pay | Admitting: Obstetrics & Gynecology

## 2022-09-01 VITALS — BP 130/77 | HR 74 | Ht 65.0 in | Wt 196.4 lb

## 2022-09-01 DIAGNOSIS — Z01419 Encounter for gynecological examination (general) (routine) without abnormal findings: Secondary | ICD-10-CM | POA: Diagnosis not present

## 2022-09-01 DIAGNOSIS — Z113 Encounter for screening for infections with a predominantly sexual mode of transmission: Secondary | ICD-10-CM

## 2022-09-01 NOTE — Progress Notes (Signed)
WELL-WOMAN EXAMINATION Patient name: Tammy Barnett MRN [id number]  Date of birth: December 12, 2000 Chief Complaint:   Gynecologic Exam  History of Present Illness:   Tammy Barnett is a 22 y.o. G0P0000  female being seen today for a routine well-woman exam.   Off OCPs since March- tracking periods. Menses are regular each month.  Denies HMB or dysmenorrhea.  Desires a pregnancy in the next upcoming years and was concerned of long term pill impact.  Denies irregular discharge, itching or irritation.  No acute complaints or concerns.   No LMP recorded. (Menstrual status: Oral contraceptives). Denies issues with her menses The current method of family planning- desires pregnancy.    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  Last mammogram: na. Last colonoscopy: na     09/01/2022    8:39 AM 03/15/2022    8:05 AM 01/05/2022    9:05 AM 12/13/2021    1:36 PM 06/21/2021   11:36 AM  Depression screen PHQ 2/9  Decreased Interest 0 0 0 0 0  Down, Depressed, Hopeless 0 0 0 0 0  PHQ - 2 Score 0 0 0 0 0  Altered sleeping 0    2  Tired, decreased energy 1    1  Change in appetite 1    3  Feeling bad or failure about yourself  0    0  Trouble concentrating 0    0  Moving slowly or fidgety/restless 0    0  Suicidal thoughts 0    0  PHQ-9 Score 2    6      Review of Systems:   Pertinent items are noted in HPI Denies any headaches, blurred vision, fatigue, shortness of breath, chest pain, abdominal pain, bowel movements, urination, or intercourse unless otherwise stated above.  Pertinent History Reviewed:  Reviewed past medical,surgical, social and family history.  Reviewed problem list, medications and allergies. Physical Assessment:   Vitals:   09/01/22 0840  BP: 130/77  Pulse: 74  Weight: 196 lb 6.4 oz (89.1 kg)  Height: 5\' 5"  (1.651 m)  Body mass index is 32.68 kg/m.        Physical Examination:   General appearance - well appearing, and in no distress  Mental status - alert, oriented  to person, place, and time  Psych:  She has a normal mood and affect  Skin - warm and dry, normal color, no suspicious lesions noted  Chest - effort normal, all lung fields clear to auscultation bilaterally  Heart - normal rate and regular rhythm  Neck:  midline trachea, no thyromegaly or nodules  Breasts - breasts appear normal, no suspicious masses, no skin or nipple changes or  axillary nodes  Abdomen - soft, nontender, nondistended, no masses or organomegaly  Pelvic - VULVA: normal appearing vulva with no masses, tenderness or lesions  VAGINA: normal appearing vagina with normal color and discharge, no lesions  CERVIX: normal appearing cervix without discharge or lesions, no CMT  Thin prep pap is done with HR HPV cotesting  UTERUS: uterus is felt to be normal size, shape, consistency and nontender   ADNEXA: No adnexal masses or tenderness noted.  Extremities:  No swelling or varicosities noted  Chaperone: Faith Rogue     Assessment & Plan:  1) Well-Woman Exam -pap collected, prior ASCUS, HPV+ -reviewed HPV, dysplasia and ASCCP guidelines -further management pending results of today -encouraged pt to quit vaping  2) Family planning -reviewed family planning goals -encouraged PNV daily and again quit vaping -  discussed fecundity and advised 1 yr of actively trying prior to being concerned regarding infertility -questions/concerns were addressed  No orders of the defined types were placed in this encounter.   Meds: No orders of the defined types were placed in this encounter.   Follow-up: Return in about 1 year (around 09/01/2023) for Annual.   Myna Hidalgo, DO Attending Obstetrician & Gynecologist, Faculty Practice Center for [facility], [facility]

## 2022-09-06 DIAGNOSIS — F411 Generalized anxiety disorder: Secondary | ICD-10-CM | POA: Diagnosis not present

## 2022-09-13 DIAGNOSIS — F411 Generalized anxiety disorder: Secondary | ICD-10-CM | POA: Diagnosis not present

## 2022-09-19 ENCOUNTER — Ambulatory Visit (INDEPENDENT_AMBULATORY_CARE_PROVIDER_SITE_OTHER): Payer: BC Managed Care – PPO | Admitting: *Deleted

## 2022-09-19 VITALS — BP 137/85 | HR 93 | Ht 65.0 in | Wt 193.2 lb

## 2022-09-19 DIAGNOSIS — N926 Irregular menstruation, unspecified: Secondary | ICD-10-CM

## 2022-09-19 LAB — POCT URINE PREGNANCY: Preg Test, Ur: POSITIVE — AB

## 2022-09-19 NOTE — Progress Notes (Signed)
   NURSE VISIT- PREGNANCY CONFIRMATION   SUBJECTIVE:  Tammy Barnett is a 22 y.o. G1P0000 female at [redacted]w[redacted]d by certain LMP of Patient's last menstrual period was 08/15/2022. Here for pregnancy confirmation.  Home pregnancy test: positive x 2   She reports nausea.  She is not taking prenatal vitamins.    OBJECTIVE:  BP (!) 144/86 (BP Location: Left Arm, Patient Position: Sitting, Cuff Size: Normal)   Pulse 97   Ht 5\' 5"  (1.651 m)   Wt 193 lb 3.2 oz (87.6 kg)   LMP 08/15/2022   BMI 32.15 kg/m   Appears well, in no apparent distress  Results for orders placed or performed in visit on 09/19/22 (from the past 24 hour(s))  POCT urine pregnancy   Collection Time: 09/19/22 11:00 AM  Result Value Ref Range   Preg Test, Ur Positive (A) Negative    ASSESSMENT: Positive pregnancy test, [redacted]w[redacted]d by LMP    PLAN: Schedule for dating ultrasound in 3 weeks Prenatal vitamins: plans to begin OTC ASAP   Nausea medicines: not currently needed   OB packet given: Yes  Annamarie Dawley  09/19/2022 11:04 AM

## 2022-09-27 DIAGNOSIS — F411 Generalized anxiety disorder: Secondary | ICD-10-CM | POA: Diagnosis not present

## 2022-10-04 DIAGNOSIS — F411 Generalized anxiety disorder: Secondary | ICD-10-CM | POA: Diagnosis not present

## 2022-10-13 ENCOUNTER — Other Ambulatory Visit: Payer: BC Managed Care – PPO

## 2022-10-27 DIAGNOSIS — Z3689 Encounter for other specified antenatal screening: Secondary | ICD-10-CM | POA: Diagnosis not present

## 2022-10-27 DIAGNOSIS — Z87891 Personal history of nicotine dependence: Secondary | ICD-10-CM | POA: Diagnosis not present

## 2022-10-27 DIAGNOSIS — N912 Amenorrhea, unspecified: Secondary | ICD-10-CM | POA: Diagnosis not present

## 2022-10-27 DIAGNOSIS — Z3A10 10 weeks gestation of pregnancy: Secondary | ICD-10-CM | POA: Diagnosis not present

## 2022-10-27 DIAGNOSIS — Z3201 Encounter for pregnancy test, result positive: Secondary | ICD-10-CM | POA: Diagnosis not present

## 2022-10-27 DIAGNOSIS — O99210 Obesity complicating pregnancy, unspecified trimester: Secondary | ICD-10-CM | POA: Diagnosis not present

## 2022-10-27 DIAGNOSIS — O99211 Obesity complicating pregnancy, first trimester: Secondary | ICD-10-CM | POA: Diagnosis not present

## 2022-10-27 DIAGNOSIS — O3680X0 Pregnancy with inconclusive fetal viability, not applicable or unspecified: Secondary | ICD-10-CM | POA: Diagnosis not present

## 2022-10-27 LAB — OB RESULTS CONSOLE PLATELET COUNT: Platelets: 290

## 2022-10-27 LAB — OB RESULTS CONSOLE VARICELLA ZOSTER ANTIBODY, IGG: Varicella: IMMUNE

## 2022-10-27 LAB — OB RESULTS CONSOLE ANTIBODY SCREEN: Antibody Screen: NEGATIVE

## 2022-10-27 LAB — HEPATITIS C ANTIBODY: HCV Ab: NEGATIVE

## 2022-10-27 LAB — OB RESULTS CONSOLE ABO/RH: RH Type: POSITIVE

## 2022-10-27 LAB — OB RESULTS CONSOLE HEPATITIS B SURFACE ANTIGEN: Hepatitis B Surface Ag: NEGATIVE

## 2022-10-27 LAB — OB RESULTS CONSOLE HIV ANTIBODY (ROUTINE TESTING): HIV: NONREACTIVE

## 2022-10-27 LAB — OB RESULTS CONSOLE RUBELLA ANTIBODY, IGM: Rubella: IMMUNE

## 2022-10-27 LAB — OB RESULTS CONSOLE RPR: RPR: NONREACTIVE

## 2022-10-27 LAB — OB RESULTS CONSOLE HGB/HCT, BLOOD
HCT: 36 (ref 29–41)
Hemoglobin: 12.1

## 2022-11-03 LAB — PANORAMA PRENATAL TEST FULL PANEL:PANORAMA TEST PLUS 5 ADDITIONAL MICRODELETIONS: FETAL FRACTION: 3.8

## 2022-12-29 ENCOUNTER — Ambulatory Visit: Payer: BC Managed Care – PPO | Admitting: Family Medicine

## 2022-12-29 VITALS — BP 107/70 | HR 92 | Temp 98.1°F | Ht 65.0 in | Wt 203.4 lb

## 2022-12-29 DIAGNOSIS — Z0000 Encounter for general adult medical examination without abnormal findings: Secondary | ICD-10-CM

## 2022-12-29 DIAGNOSIS — Z3A19 19 weeks gestation of pregnancy: Secondary | ICD-10-CM | POA: Diagnosis not present

## 2022-12-29 DIAGNOSIS — Z363 Encounter for antenatal screening for malformations: Secondary | ICD-10-CM | POA: Diagnosis not present

## 2022-12-29 DIAGNOSIS — Z3689 Encounter for other specified antenatal screening: Secondary | ICD-10-CM | POA: Diagnosis not present

## 2022-12-29 NOTE — Progress Notes (Signed)
   Subjective:    Patient ID: Tammy Barnett, female    DOB: [birth year], 22 y.o.   MRN: [id number]  HPI The patient comes in today for a wellness visit.    A review of their health history was completed.  A review of medications was also completed.  Any needed refills; No  Eating habits: Good  Falls/  MVA accidents in past few months: No  Regular exercise: Not really  Specialist pt sees on regular basis: OBGYN  Preventative health issues were discussed.   Additional concerns: None at this time   Review of Systems     Objective:   Physical Exam General-in no acute distress Eyes-no discharge Lungs-respiratory rate normal, CTA CV-no murmurs,RRR Extremities skin warm dry no edema Neuro grossly normal Behavior normal, alert   Patient not depressed or anxious Patient is able to lift twist and bend currently No restrictions I did let her know that she ought to consider keeping her on lifting to 10 to 15 pounds because of pregnancy She is capable of doing the teacher substitution No communicable diseases Not smoking or drinking      Assessment & Plan:  Adult wellness-complete.wellness physical was conducted today. Importance of diet and exercise were discussed in detail.  Importance of stress reduction and healthy living were discussed.  In addition to this a discussion regarding safety was also covered.  We also reviewed over immunizations and gave recommendations regarding current immunization needed for age.   In addition to this additional areas were also touched on including: Preventative health exams needed:  Colonoscopy not indicated  Patient was advised yearly wellness exam   Patient is pregnant she will talk with her OB/GYN regarding Tdap

## 2023-01-26 DIAGNOSIS — B070 Plantar wart: Secondary | ICD-10-CM | POA: Diagnosis not present

## 2023-01-26 DIAGNOSIS — M79671 Pain in right foot: Secondary | ICD-10-CM | POA: Diagnosis not present

## 2023-02-08 NOTE — L&D Delivery Note (Signed)
 OB/GYN Faculty Practice Delivery Note  Tammy Barnett is a 23 y.o. G1P0000 s/p induced vaginal delivery at [redacted]w[redacted]d. She was admitted for induction of labor for gestational hypertension.   ROM: 18h 10m with clear fluid GBS Status:  Negative/-- (03/12 0000) Maximum Maternal Temperature: 100.5  Labor Progress: Initial SVE: closed. She then progressed to complete with augmentation via Foley, cytotec, pitocin, and AROM. Labor course complicated by triple I treated with amp/gent, also protracted second stage (pushed for 2.5 hours)  Delivery Date/Time: 05/02/23 12:35 PM  Delivery: Called to room and patient was complete and pushing. Anticipating shoulder dystocia (LGA, protracted second stage), multiple attendants were in the room for delivery with step-stools at either side of the bed. Head delivered OA with turtling. No nuchal cord present. Unable to deliver the anterior shoulder with standard downward traction, so mcroberts and suprapubic pressure were attempted, not successful. Attempt was then made to deliver the posterior shoulder. Unable to actually deliver that shoulder, but by grasping underneath the left axila, with counter-clockwise rotation was able to deliver the anterior shoulder and then the rest of the body with ease. Duration of dystocia 75 seconds. Poor infant tone, cord was immediately clamped and cut and the infant handed to the neonatal resuscitation team. Cord blood and cord pH drawn. Placenta delivered spontaneously with gentle cord traction. Fundus firm with massage and Pitocin. Labia, perineum, vagina, and cervix inspected inspected with 2nd degree perineal laceration. Dr. Ashok Pall performed the delivery of the infant; Dr. Leanora Cover performed the delivery of the placenta and repair of the perineal laceration.  Baby Weight: pending  Placenta: Sent to L&D Complications: triple I, 75 second shoulder dystocia Lacerations: 2nd degree perineal repaired with 3-0 vicryl in the standard  fashion EBL: 223 mL Analgesia: Epidural   Infant:  APGAR (1 MIN):   APGAR (5 MINS): 8  APGAR (10 MINS):     Shonna Chock, MD Center for [facility], [facility] 05/02/2023, 1:10 PM

## 2023-02-14 ENCOUNTER — Ambulatory Visit: Payer: Medicaid Other | Admitting: Family Medicine

## 2023-02-14 VITALS — BP 128/79 | Temp 98.1°F | Ht 65.0 in | Wt 223.4 lb

## 2023-02-14 DIAGNOSIS — J069 Acute upper respiratory infection, unspecified: Secondary | ICD-10-CM | POA: Diagnosis not present

## 2023-02-14 MED ORDER — PROMETHAZINE-DM 6.25-15 MG/5ML PO SYRP: 5.0000 mL | ORAL_SOLUTION | Freq: Four times a day (QID) | ORAL | 0 refills | Status: DC | PRN

## 2023-02-14 MED ORDER — FLUTICASONE PROPIONATE 50 MCG/ACT NA SUSP: 2.0000 | Freq: Every day | NASAL | 6 refills | Status: DC

## 2023-02-14 NOTE — Assessment & Plan Note (Signed)
 Flonase as directed.  Supportive care.

## 2023-02-14 NOTE — Progress Notes (Signed)
 Subjective:  Patient ID: Tammy Barnett, female    DOB: 12/24/2000  Age: 23 y.o. MRN: [id number]  CC:   Chief Complaint  Patient presents with   Nasal Congestion    Since Saturday [redacted] weeks pregnant- taken robitussin per OB    HPI:  23 year old female who is currently [redacted] weeks pregnant presents with respiratory symptoms.  Symptoms started on Saturday.  She reports a dry, scratchy throat.  She has had some recent hoarseness as well.  Mild congestion.  OB/GYN suggested over-the-counter antihistamine and Robitussin.  She has had no improvement.  No fever.  No other complaints at this time.  Patient Active Problem List   Diagnosis Date Noted   Viral URI 02/14/2023   Boil 04/27/2022   Strep throat 06/30/2021   ASCUS with positive high risk HPV cervical 06/28/2021   Sleep disturbance 06/21/2021   Trauma in childhood 11/20/2020   Anxiety 04/11/2017   Thyroid  nodule 09/10/2015    Social Hx   Social History   Socioeconomic History   Marital status: Single    Spouse name: Not on file   Number of children: Not on file   Years of education: Not on file   Highest education level: Associate degree: occupational, scientist, product/process development, or vocational program  Occupational History   Not on file  Tobacco Use   Smoking status: Never   Smokeless tobacco: Never  Vaping Use   Vaping status: Every Day  Substance and Sexual Activity   Alcohol use: Yes    Comment: occ   Drug use: No   Sexual activity: Yes    Birth control/protection: None  Other Topics Concern   Not on file  Social History Narrative   Not on file   Social Drivers of Health   Financial Resource Strain: Low Risk  (12/29/2022)   Overall Financial Resource Strain (CARDIA)    Difficulty of Paying Living Expenses: Not hard at all  Food Insecurity: No Food Insecurity (12/29/2022)   Hunger Vital Sign    Worried About Running Out of Food in the Last Year: Never true    Ran Out of Food in the Last Year: Never true   Transportation Needs: No Transportation Needs (12/29/2022)   PRAPARE - Administrator, Civil Service (Medical): No    Lack of Transportation (Non-Medical): No  Physical Activity: Inactive (12/29/2022)   Exercise Vital Sign    Days of Exercise per Week: 0 days    Minutes of Exercise per Session: 0 min  Stress: No Stress Concern Present (12/29/2022)   [employer] of Occupational Health - Occupational Stress Questionnaire    Feeling of Stress : Not at all  Social Connections: Moderately Isolated (12/29/2022)   Social Connection and Isolation Panel [NHANES]    Frequency of Communication with Friends and Family: More than three times a week    Frequency of Social Gatherings with Friends and Family: Twice a week    Attends Religious Services: More than 4 times per year    Active Member of [employer] or Organizations: No    Attends Engineer, Structural: Never    Marital Status: Never married    Review of Systems Per HPI  Objective:  BP 128/79   Temp 98.1 F (36.7 C) (Oral)   Ht 5' 5 (1.651 m)   Wt 223 lb 6.4 oz (101.3 kg)   LMP 08/15/2022   BMI 37.18 kg/m      02/14/2023    3:59 PM 12/29/2022  8:26 AM 09/19/2022   11:06 AM  BP/Weight  Systolic BP 128 107 137  Diastolic BP 79 70 85  Wt. (Lbs) 223.4 203.4   BMI 37.18 kg/m2 33.85 kg/m2     Physical Exam Vitals and nursing note reviewed.  Constitutional:      General: She is not in acute distress. HENT:     Head: Normocephalic and atraumatic.     Mouth/Throat:     Pharynx: Oropharynx is clear.  Eyes:     General:        Right eye: No discharge.        Left eye: No discharge.     Conjunctiva/sclera: Conjunctivae normal.  Cardiovascular:     Rate and Rhythm: Normal rate and regular rhythm.  Pulmonary:     Effort: Pulmonary effort is normal.     Breath sounds: Normal breath sounds. No wheezing or rales.  Neurological:     Mental Status: She is alert.     Lab Results  Component Value Date    WBC 6.9 12/13/2021   HGB 13.5 12/13/2021   HCT 40.6 12/13/2021   PLT 328 12/13/2021   GLUCOSE 83 12/13/2021   ALT 13 12/13/2021   AST 16 12/13/2021   NA 141 12/13/2021   K 4.3 12/13/2021   CL 103 12/13/2021   CREATININE 0.57 12/13/2021   BUN 9 12/13/2021   CO2 23 12/13/2021   TSH 1.600 12/13/2021     Assessment & Plan:   Problem List Items Addressed This Visit       Respiratory   Viral URI - Primary   Flonase  as directed.  Supportive care.        Meds ordered this encounter  Medications   fluticasone  (FLONASE ) 50 MCG/ACT nasal spray    Sig: Place 2 sprays into both nostrils daily.    Dispense:  16 g    Refill:  6    Please do not fill Rx for Promethazine  DM. Was sent in erroneously.    Follow-up:  Return if symptoms worsen or fail to improve.  Jacqulyn Ahle DO [facility]

## 2023-02-23 DIAGNOSIS — Z362 Encounter for other antenatal screening follow-up: Secondary | ICD-10-CM | POA: Diagnosis not present

## 2023-02-23 DIAGNOSIS — Z3A27 27 weeks gestation of pregnancy: Secondary | ICD-10-CM | POA: Diagnosis not present

## 2023-02-23 DIAGNOSIS — Z3201 Encounter for pregnancy test, result positive: Secondary | ICD-10-CM | POA: Diagnosis not present

## 2023-02-23 DIAGNOSIS — N912 Amenorrhea, unspecified: Secondary | ICD-10-CM | POA: Diagnosis not present

## 2023-02-23 DIAGNOSIS — Z3403 Encounter for supervision of normal first pregnancy, third trimester: Secondary | ICD-10-CM | POA: Diagnosis not present

## 2023-02-23 DIAGNOSIS — Z3689 Encounter for other specified antenatal screening: Secondary | ICD-10-CM | POA: Diagnosis not present

## 2023-02-23 LAB — OB RESULTS CONSOLE RPR: RPR: NONREACTIVE

## 2023-02-23 LAB — OB RESULTS CONSOLE PLATELET COUNT: Platelets: 307

## 2023-02-23 LAB — OB RESULTS CONSOLE HGB/HCT, BLOOD
HCT: 31 (ref 29–41)
Hemoglobin: 10.4

## 2023-02-23 LAB — OB RESULTS CONSOLE HIV ANTIBODY (ROUTINE TESTING): HIV: NONREACTIVE

## 2023-02-23 LAB — GLUCOSE TOLERANCE, 1 HOUR: Glucose, 1 Hour GTT: 144

## 2023-03-02 DIAGNOSIS — O368130 Decreased fetal movements, third trimester, not applicable or unspecified: Secondary | ICD-10-CM | POA: Diagnosis not present

## 2023-03-02 DIAGNOSIS — O368190 Decreased fetal movements, unspecified trimester, not applicable or unspecified: Secondary | ICD-10-CM | POA: Diagnosis not present

## 2023-03-02 DIAGNOSIS — Z3A28 28 weeks gestation of pregnancy: Secondary | ICD-10-CM | POA: Diagnosis not present

## 2023-03-06 DIAGNOSIS — O99810 Abnormal glucose complicating pregnancy: Secondary | ICD-10-CM | POA: Diagnosis not present

## 2023-03-06 LAB — GLUCOSE TOLERANCE, 3 HOURS
Glucose Fasting: 92
Glucose, GTT - 1 Hour: 151 (ref ?–200)
Glucose, GTT - 2 Hour: 139 (ref ?–140)
Glucose, GTT - 3 Hour: 106 mg/dL (ref ?–140)

## 2023-03-15 ENCOUNTER — Encounter (HOSPITAL_COMMUNITY): Payer: Self-pay

## 2023-03-15 ENCOUNTER — Emergency Department (HOSPITAL_COMMUNITY)
Admission: EM | Admit: 2023-03-15 | Discharge: 2023-03-15 | Disposition: A | Discharge status: Home / Self Care | Payer: Medicaid Other | Attending: Emergency Medicine | Admitting: Emergency Medicine

## 2023-03-15 ENCOUNTER — Emergency Department (HOSPITAL_COMMUNITY): Payer: Medicaid Other

## 2023-03-15 ENCOUNTER — Other Ambulatory Visit: Payer: Self-pay

## 2023-03-15 DIAGNOSIS — O99283 Endocrine, nutritional and metabolic diseases complicating pregnancy, third trimester: Secondary | ICD-10-CM | POA: Diagnosis not present

## 2023-03-15 DIAGNOSIS — Z3A30 30 weeks gestation of pregnancy: Secondary | ICD-10-CM | POA: Insufficient documentation

## 2023-03-15 DIAGNOSIS — O98513 Other viral diseases complicating pregnancy, third trimester: Secondary | ICD-10-CM | POA: Insufficient documentation

## 2023-03-15 DIAGNOSIS — O26893 Other specified pregnancy related conditions, third trimester: Secondary | ICD-10-CM | POA: Diagnosis present

## 2023-03-15 DIAGNOSIS — E876 Hypokalemia: Secondary | ICD-10-CM | POA: Insufficient documentation

## 2023-03-15 DIAGNOSIS — R059 Cough, unspecified: Secondary | ICD-10-CM | POA: Diagnosis not present

## 2023-03-15 DIAGNOSIS — R0989 Other specified symptoms and signs involving the circulatory and respiratory systems: Secondary | ICD-10-CM | POA: Diagnosis not present

## 2023-03-15 DIAGNOSIS — R0602 Shortness of breath: Secondary | ICD-10-CM | POA: Diagnosis not present

## 2023-03-15 DIAGNOSIS — Z20822 Contact with and (suspected) exposure to covid-19: Secondary | ICD-10-CM | POA: Diagnosis not present

## 2023-03-15 DIAGNOSIS — J101 Influenza due to other identified influenza virus with other respiratory manifestations: Secondary | ICD-10-CM | POA: Diagnosis not present

## 2023-03-15 LAB — URINALYSIS, ROUTINE W REFLEX MICROSCOPIC
Bilirubin Urine: NEGATIVE
Glucose, UA: NEGATIVE mg/dL
Hgb urine dipstick: NEGATIVE
Ketones, ur: NEGATIVE mg/dL
Leukocytes,Ua: NEGATIVE
Nitrite: NEGATIVE
Protein, ur: NEGATIVE mg/dL
Specific Gravity, Urine: 1.012 (ref 1.005–1.030)
pH: 7 (ref 5.0–8.0)

## 2023-03-15 LAB — COMPREHENSIVE METABOLIC PANEL
ALT: 17 U/L (ref 0–44)
AST: 22 U/L (ref 15–41)
Albumin: 2.9 g/dL — ABNORMAL LOW (ref 3.5–5.0)
Alkaline Phosphatase: 108 U/L (ref 38–126)
Anion gap: 9 (ref 5–15)
BUN: <5 mg/dL — ABNORMAL LOW (ref 6–20)
CO2: 20 mmol/L — ABNORMAL LOW (ref 22–32)
Calcium: 8.4 mg/dL — ABNORMAL LOW (ref 8.9–10.3)
Chloride: 105 mmol/L (ref 98–111)
Creatinine, Ser: 0.4 mg/dL — ABNORMAL LOW (ref 0.44–1.00)
GFR, Estimated: >60 mL/min (ref >60)
Glucose, Bld: 102 mg/dL — ABNORMAL HIGH (ref 70–99)
Potassium: 2.9 mmol/L — ABNORMAL LOW (ref 3.5–5.1)
Sodium: 134 mmol/L — ABNORMAL LOW (ref 135–145)
Total Bilirubin: 0.4 mg/dL (ref 0.0–1.2)
Total Protein: 6.4 g/dL — ABNORMAL LOW (ref 6.5–8.1)

## 2023-03-15 LAB — CBC WITH DIFFERENTIAL/PLATELET
Abs Immature Granulocytes: 0.12 10*3/uL — ABNORMAL HIGH (ref 0.00–0.07)
Basophils Absolute: 0 10*3/uL (ref 0.0–0.1)
Basophils Relative: 0 %
Eosinophils Absolute: 0 10*3/uL (ref 0.0–0.5)
Eosinophils Relative: 0 %
HCT: 29.4 % — ABNORMAL LOW (ref 36.0–46.0)
Hemoglobin: 9.7 g/dL — ABNORMAL LOW (ref 12.0–15.0)
Immature Granulocytes: 1 %
Lymphocytes Relative: 5 %
Lymphs Abs: 0.5 10*3/uL — ABNORMAL LOW (ref 0.7–4.0)
MCH: 27.3 pg (ref 26.0–34.0)
MCHC: 33 g/dL (ref 30.0–36.0)
MCV: 82.8 fL (ref 80.0–100.0)
Monocytes Absolute: 0.6 10*3/uL (ref 0.1–1.0)
Monocytes Relative: 7 %
Neutro Abs: 8.1 10*3/uL — ABNORMAL HIGH (ref 1.7–7.7)
Neutrophils Relative %: 87 %
Platelets: 247 10*3/uL (ref 150–400)
RBC: 3.55 MIL/uL — ABNORMAL LOW (ref 3.87–5.11)
RDW: 13.8 % (ref 11.5–15.5)
WBC: 9.4 10*3/uL (ref 4.0–10.5)
nRBC: 0 % (ref 0.0–0.2)

## 2023-03-15 LAB — PROTEIN / CREATININE RATIO, URINE
Creatinine, Urine: 83 mg/dL
Protein Creatinine Ratio: 0.16 mg/mg{creat} — ABNORMAL HIGH (ref 0.00–0.15)
Total Protein, Urine: 13 mg/dL

## 2023-03-15 LAB — RESP PANEL BY RT-PCR (RSV, FLU A&B, COVID)  RVPGX2
Influenza A by PCR: POSITIVE — AB
Influenza B by PCR: NEGATIVE
Resp Syncytial Virus by PCR: NEGATIVE
SARS Coronavirus 2 by RT PCR: NEGATIVE

## 2023-03-15 LAB — MAGNESIUM: Magnesium: 1.8 mg/dL (ref 1.7–2.4)

## 2023-03-15 MED ORDER — MAGNESIUM SULFATE IN D5W 1-5 GM/100ML-% IV SOLN
1.0000 g | Freq: Once | INTRAVENOUS | Status: AC
  Administered 2023-03-15: 1 g via INTRAVENOUS
  Filled 2023-03-15: qty 100

## 2023-03-15 MED ORDER — LACTATED RINGERS IV BOLUS
1000.0000 mL | Freq: Once | INTRAVENOUS | Status: AC
  Administered 2023-03-15: 1000 mL via INTRAVENOUS

## 2023-03-15 MED ORDER — ACETAMINOPHEN 325 MG PO TABS
650.0000 mg | ORAL_TABLET | Freq: Once | ORAL | Status: AC
  Administered 2023-03-15: 650 mg via ORAL
  Filled 2023-03-15: qty 2

## 2023-03-15 MED ORDER — POTASSIUM CHLORIDE 20 MEQ PO PACK
40.0000 meq | PACK | Freq: Once | ORAL | Status: AC
  Administered 2023-03-15: 40 meq via ORAL
  Filled 2023-03-15: qty 2

## 2023-03-15 MED ORDER — ACETAMINOPHEN 500 MG PO TABS: 500.0000 mg | ORAL_TABLET | Freq: Once | ORAL | Status: DC

## 2023-03-15 MED ORDER — ONDANSETRON 4 MG PO TBDP: 4.0000 mg | ORAL_TABLET | Freq: Three times a day (TID) | ORAL | 0 refills | Status: DC | PRN

## 2023-03-15 MED ORDER — POTASSIUM CHLORIDE CRYS ER 20 MEQ PO TBCR
20.0000 meq | EXTENDED_RELEASE_TABLET | Freq: Once | ORAL | Status: AC
Start: 2023-03-15 — End: 2023-03-15
  Administered 2023-03-15: 20 meq via ORAL
  Filled 2023-03-15: qty 1

## 2023-03-15 MED ORDER — ONDANSETRON HCL 4 MG/2ML IJ SOLN: 4.0000 mg | Freq: Four times a day (QID) | INTRAMUSCULAR | Status: DC | PRN

## 2023-03-15 MED ORDER — POTASSIUM CHLORIDE 10 MEQ/100ML IV SOLN
10.0000 meq | INTRAVENOUS | Status: DC
Start: 2023-03-15 — End: 2023-03-15
  Administered 2023-03-15: 10 meq via INTRAVENOUS
  Filled 2023-03-15 (×2): qty 100

## 2023-03-15 MED ORDER — SODIUM CHLORIDE 0.9 % IV SOLN
INTRAVENOUS | Status: DC
Start: 2023-03-15 — End: 2023-03-15

## 2023-03-15 NOTE — ED Provider Notes (Signed)
 [place] EMERGENCY DEPARTMENT AT [facility] Provider Note   CSN: [id number] Arrival date & time: 03/15/23  [id number]     History  No chief complaint on file.   Tammy Barnett is a 23 y.o. female.  HPI Patient presents for flulike symptoms.  Medical history includes anxiety, depression.  She is G1, P0.  She is currently [redacted] weeks pregnant.  She is followed by [facility] women's health and eating.  Last prenatal visit was a week ago.  Pregnancy has been uncomplicated thus far.  Onset of flulike symptoms was yesterday.  Symptoms have included myalgias, cough, fatigue, fever, nausea, vomiting.  2 days ago, she developed a cough.  Yesterday, she developed fevers and myalgias.  She took some Tylenol  before bed around 8 PM.  She woke up at 3 with recurrence of fever.  She took more Tylenol  and have an episode of emesis and diarrhea about 10 minutes after.  Currently, she endorses some myalgias.  She denies any current nausea.  She does not have a headache.  She has had some leg swelling but this has been consistent during her pregnancy.    Home Medications Prior to Admission medications   Medication Sig Start Date End Date Taking? Authorizing Provider  ondansetron  (ZOFRAN -ODT) 4 MG disintegrating tablet Take 1 tablet (4 mg total) by mouth every 8 (eight) hours as needed for nausea or vomiting. 03/15/23  Yes Melvenia Motto, MD  fluticasone  (FLONASE ) 50 MCG/ACT nasal spray Place 2 sprays into both nostrils daily. 02/14/23   Cook, Jayce G, DO      Allergies    Patient has no known allergies.    Review of Systems   Review of Systems  Constitutional:  Positive for chills, fatigue and fever.  Respiratory:  Positive for cough and shortness of breath.   Gastrointestinal:  Positive for diarrhea and vomiting.  Musculoskeletal:  Positive for myalgias.  All other systems reviewed and are negative.   Physical Exam Updated Vital Signs BP 130/74   Pulse (!) 103   Temp 98.7 F (37.1 C) (Oral)    Resp (!) 22   LMP 08/31/2022   SpO2 98%   Breastfeeding Unknown  Physical Exam Vitals and nursing note reviewed.  Constitutional:      General: She is not in acute distress.    Appearance: Normal appearance. She is well-developed. She is not ill-appearing, toxic-appearing or diaphoretic.  HENT:     Head: Normocephalic and atraumatic.     Right Ear: External ear normal.     Left Ear: External ear normal.     Nose: Nose normal.     Mouth/Throat:     Mouth: Mucous membranes are moist.  Eyes:     Extraocular Movements: Extraocular movements intact.     Conjunctiva/sclera: Conjunctivae normal.  Cardiovascular:     Rate and Rhythm: Normal rate and regular rhythm.     Heart sounds: No murmur heard. Pulmonary:     Effort: Pulmonary effort is normal. No respiratory distress.     Breath sounds: Normal breath sounds. No wheezing, rhonchi or rales.  Abdominal:     General: There is distension.     Palpations: Abdomen is soft.     Tenderness: There is no abdominal tenderness.  Musculoskeletal:        General: No swelling. Normal range of motion.     Cervical back: Normal range of motion and neck supple.  Skin:    General: Skin is warm and dry.  Coloration: Skin is not jaundiced or pale.  Neurological:     General: No focal deficit present.     Mental Status: She is alert and oriented to person, place, and time.     Cranial Nerves: No cranial nerve deficit.     Sensory: No sensory deficit.     Motor: No weakness.     Coordination: Coordination normal.  Psychiatric:        Mood and Affect: Mood normal.        Behavior: Behavior normal.     ED Results / Procedures / Treatments   Labs (all labs ordered are listed, but only abnormal results are displayed) Labs Reviewed  RESP PANEL BY RT-PCR (RSV, FLU A&B, COVID)  RVPGX2 - Abnormal; Notable for the following components:      Result Value   Influenza A by PCR POSITIVE (*)    All other components within normal limits  CBC WITH  DIFFERENTIAL/PLATELET - Abnormal; Notable for the following components:   RBC 3.55 (*)    Hemoglobin 9.7 (*)    HCT 29.4 (*)    Neutro Abs 8.1 (*)    Lymphs Abs 0.5 (*)    Abs Immature Granulocytes 0.12 (*)    All other components within normal limits  COMPREHENSIVE METABOLIC PANEL - Abnormal; Notable for the following components:   Sodium 134 (*)    Potassium 2.9 (*)    CO2 20 (*)    Glucose, Bld 102 (*)    BUN <5 (*)    Creatinine, Ser 0.40 (*)    Calcium  8.4 (*)    Total Protein 6.4 (*)    Albumin 2.9 (*)    All other components within normal limits  URINALYSIS, ROUTINE W REFLEX MICROSCOPIC - Abnormal; Notable for the following components:   APPearance HAZY (*)    All other components within normal limits  PROTEIN / CREATININE RATIO, URINE - Abnormal; Notable for the following components:   Protein Creatinine Ratio 0.16 (*)    All other components within normal limits  MAGNESIUM     EKG None  Radiology DG Chest Portable 1 View Result Date: 03/15/2023 CLINICAL DATA:  Shortness of breath.  Cough. EXAM: PORTABLE CHEST 1 VIEW COMPARISON:  07/05/2021 FINDINGS: Low volume film. Cardiopericardial silhouette is at upper limits of normal for size. There is pulmonary vascular congestion without overt pulmonary edema. No acute bony abnormality. Telemetry leads overlie the chest. IMPRESSION: No active disease. Electronically Signed   By: Camellia Candle M.D.   On: 03/15/2023 06:52    Procedures Procedures    Medications Ordered in ED Medications  ondansetron  (ZOFRAN ) injection 4 mg (has no administration in time range)  potassium chloride  10 mEq in 100 mL IVPB (10 mEq Intravenous New Bag/Given 03/15/23 0611)  0.9 %  sodium chloride  infusion ( Intravenous New Bag/Given 03/15/23 0612)  potassium chloride  SA (KLOR-CON  M) CR tablet 20 mEq (has no administration in time range)  acetaminophen  (TYLENOL ) tablet 650 mg (has no administration in time range)  lactated ringers  bolus 1,000 mL (0 mLs  Intravenous Stopped 03/15/23 0615)  magnesium  sulfate IVPB 1 g 100 mL (1 g Intravenous New Bag/Given 03/15/23 0612)  potassium chloride  (KLOR-CON ) packet 40 mEq (40 mEq Oral Given 03/15/23 [id number])    ED Course/ Medical Decision Making/ A&P                                 Medical Decision Making  Amount and/or Complexity of Data Reviewed Labs: ordered. Radiology: ordered.  Risk OTC drugs. Prescription drug management.   This patient presents to the ED for concern of flulike symptoms, this involves an extensive number of treatment options, and is a complaint that carries with it a high risk of complications and morbidity.  The differential diagnosis includes URI, pneumonia, dehydration, metabolic arrangements   Co morbidities that complicate the patient evaluation  Anxiety, depression, current pregnancy   Additional history obtained:  Additional history obtained from N/A External records from outside source obtained and reviewed including EMR   Lab Tests:  I Ordered, and personally interpreted labs.  The pertinent results include: Hypokalemia is present.  Patient tested positive for influenza A.  Lab work is otherwise unremarkable.   Imaging Studies ordered:  I ordered imaging studies including x-ray I independently visualized and interpreted imaging which showed no acute findings I agree with the radiologist interpretation   Cardiac Monitoring: / EKG:  The patient was maintained on a cardiac monitor.  I personally viewed and interpreted the cardiac monitored which showed an underlying rhythm of: Sinus rhythm  Problem List / ED Course / Critical interventions / Medication management  Patient presenting for 2 days of flulike symptoms.  Initially, she developed a deep cough.  She has since developed myalgias, fevers, chills.  She had an episode of emesis prior to arrival.  Initial vital signs in the ED notable for tachycardia and hypertension.  Patient does not have a history of  hypertension, including during her current pregnancy.  This does raise concern of possible preeclampsia.  On assessment, she is well-appearing.  She denies any headache.  She does have some mild leg swelling but she states that this has been consistent throughout most of her pregnancy.  She does not have any abdominal pain.  Lungs are clear to auscultation.  Patient was given IV fluids.  Workup was initiated.  Patient's blood pressure normalized.  Lab work is reassuring from preeclampsia standpoint.  She did have positive for influenza A.  She was also found to have hypokalemia.  Potassium chloride  and magnesium  sulfate ordered for electrolyte optimization.  She was unable to tolerate the Klor-Con  powder due to the taste.  She drank about half of it.  She was given 20 mEq dose tablet instead.  Patient had improved symptoms while in the ED.  She was advised to continue supportive care at home.  She was discharged in stable condition. I ordered medication including potassium chloride  and magnesium  sulfate for electrolyte optimization; IV fluids for hydration; Tylenol  for symptomatic relief Reevaluation of the patient after these medicines showed that the patient improved I have reviewed the patients home medicines and have made adjustments as needed   Social Determinants of Health:  Has access to outpatient care         Final Clinical Impression(s) / ED Diagnoses Final diagnoses:  Influenza A  Hypokalemia    Rx / DC Orders ED Discharge Orders          Ordered    ondansetron  (ZOFRAN -ODT) 4 MG disintegrating tablet  Every 8 hours PRN        03/15/23 0725              Melvenia Motto, MD 03/15/23 [phone]

## 2023-03-15 NOTE — Discharge Instructions (Addendum)
 You have the flu.  Continue scheduled Tylenol .  You can take 650 mg every 4 hours or 1000 mg every 6 hours.  Avoid taking more than 4000 mg in 24 hours.  A prescription for a medication called ondansetron  was sent to your pharmacy.  Take this as needed for nausea.  Drink plenty of fluids to stay hydrated.  Return to the emergency department for any new or worsening symptoms of concern.

## 2023-03-15 NOTE — ED Triage Notes (Signed)
 Pt presents with cough since yesterday and now states that her body is sore from head to toe. Pt ran fever of 101.8. Took tylenol . Endorses N/V.  Pt is [redacted] weeks pregnant.

## 2023-04-05 DIAGNOSIS — Z3689 Encounter for other specified antenatal screening: Secondary | ICD-10-CM | POA: Diagnosis not present

## 2023-04-12 DIAGNOSIS — O99213 Obesity complicating pregnancy, third trimester: Secondary | ICD-10-CM | POA: Diagnosis not present

## 2023-04-12 DIAGNOSIS — Z6841 Body Mass Index (BMI) 40.0 and over, adult: Secondary | ICD-10-CM | POA: Diagnosis not present

## 2023-04-13 ENCOUNTER — Inpatient Hospital Stay (HOSPITAL_COMMUNITY)
Admission: AD | Admit: 2023-04-13 | Discharge: 2023-04-13 | Disposition: A | Discharge status: Home / Self Care | Attending: Obstetrics & Gynecology | Admitting: Obstetrics & Gynecology

## 2023-04-13 ENCOUNTER — Encounter (HOSPITAL_COMMUNITY): Payer: Self-pay | Admitting: Obstetrics & Gynecology

## 2023-04-13 DIAGNOSIS — O133 Gestational [pregnancy-induced] hypertension without significant proteinuria, third trimester: Secondary | ICD-10-CM | POA: Insufficient documentation

## 2023-04-13 DIAGNOSIS — O163 Unspecified maternal hypertension, third trimester: Secondary | ICD-10-CM

## 2023-04-13 DIAGNOSIS — R030 Elevated blood-pressure reading, without diagnosis of hypertension: Secondary | ICD-10-CM | POA: Diagnosis present

## 2023-04-13 DIAGNOSIS — M7989 Other specified soft tissue disorders: Secondary | ICD-10-CM

## 2023-04-13 DIAGNOSIS — Z3A34 34 weeks gestation of pregnancy: Secondary | ICD-10-CM | POA: Diagnosis not present

## 2023-04-13 DIAGNOSIS — R600 Localized edema: Secondary | ICD-10-CM | POA: Diagnosis present

## 2023-04-13 LAB — CBC
HCT: 29.4 % — ABNORMAL LOW (ref 36.0–46.0)
Hemoglobin: 9.6 g/dL — ABNORMAL LOW (ref 12.0–15.0)
MCH: 25.1 pg — ABNORMAL LOW (ref 26.0–34.0)
MCHC: 32.7 g/dL (ref 30.0–36.0)
MCV: 77 fL — ABNORMAL LOW (ref 80.0–100.0)
Platelets: 265 10*3/uL (ref 150–400)
RBC: 3.82 MIL/uL — ABNORMAL LOW (ref 3.87–5.11)
RDW: 14.6 % (ref 11.5–15.5)
WBC: 12 10*3/uL — ABNORMAL HIGH (ref 4.0–10.5)
nRBC: 0 % (ref 0.0–0.2)

## 2023-04-13 LAB — PROTEIN / CREATININE RATIO, URINE
Creatinine, Urine: 110 mg/dL
Protein Creatinine Ratio: 0.16 mg/mg{creat} — ABNORMAL HIGH (ref 0.00–0.15)
Total Protein, Urine: 18 mg/dL

## 2023-04-13 LAB — COMPREHENSIVE METABOLIC PANEL
ALT: 15 U/L (ref 0–44)
AST: 20 U/L (ref 15–41)
Albumin: 2.5 g/dL — ABNORMAL LOW (ref 3.5–5.0)
Alkaline Phosphatase: 142 U/L — ABNORMAL HIGH (ref 38–126)
Anion gap: 12 (ref 5–15)
BUN: 5 mg/dL — ABNORMAL LOW (ref 6–20)
CO2: 21 mmol/L — ABNORMAL LOW (ref 22–32)
Calcium: 8.9 mg/dL (ref 8.9–10.3)
Chloride: 104 mmol/L (ref 98–111)
Creatinine, Ser: 0.4 mg/dL — ABNORMAL LOW (ref 0.44–1.00)
GFR, Estimated: >60 mL/min (ref >60)
Glucose, Bld: 109 mg/dL — ABNORMAL HIGH (ref 70–99)
Potassium: 3.4 mmol/L — ABNORMAL LOW (ref 3.5–5.1)
Sodium: 137 mmol/L (ref 135–145)
Total Bilirubin: 0.4 mg/dL (ref 0.0–1.2)
Total Protein: 6.3 g/dL — ABNORMAL LOW (ref 6.5–8.1)

## 2023-04-13 NOTE — MAU Provider Note (Signed)
 History     CSN: [id number]  Arrival date and time: 04/13/23 1617   None     Chief Complaint  Patient presents with   Hypertension   Bilateral Swelling of Hands and Feet   HPI Patient is a 23 year old G1 at 64 weeks presenting for concerns for swelling in lower extremities, increased blood pressure.  Reports that last week she took her blood pressure was in the 140s/70s.  She then had a migraine later in the week and on Tuesday took her blood pressure again and it was also in the 140s/70s.  She has also noticed considerably worsening swelling in her lower extremities over the last few weeks so she was worried about preeclampsia.  Denies any recent headaches, changes in vision, right upper quadrant pain.  She does endorse itching in her hands and feet which is worse at night.  OB History     Gravida  1   Para  0   Term  0   Preterm  0   AB  0   Living  0      SAB  0   IAB  0   Ectopic  0   Multiple  0   Live Births              Past Medical History:  Diagnosis Date   Anxiety    ASCUS with positive high risk HPV cervical 06/28/2021   06/28/21 repeat in 1 year per [facility]    Breast lump 09/10/2015   Depression    Dysmenorrhea 01/08/2014   Encounter for menstrual regulation 01/15/2014   Irregular intermenstrual bleeding 08/27/2014   Right ovarian cyst 01/15/2014   Ruptured ovarian cyst 01/08/2014   Thyroid nodule 09/10/2015   Vaginal odor 04/16/2014    History reviewed. No pertinent surgical history.  Family History  Problem Relation Age of Onset   Hypertension Father    Diabetes Paternal Grandmother    Hypertension Paternal Grandfather     Social History   Tobacco Use   Smoking status: Never   Smokeless tobacco: Never  Vaping Use   Vaping status: Every Day  Substance Use Topics   Alcohol use: Yes    Comment: occ   Drug use: No    Allergies: No Known Allergies  Medications Prior to Admission  Medication Sig Dispense Refill Last Dose/Taking    fluticasone (FLONASE) 50 MCG/ACT nasal spray Place 2 sprays into both nostrils daily. 16 g 6    ondansetron (ZOFRAN-ODT) 4 MG disintegrating tablet Take 1 tablet (4 mg total) by mouth every 8 (eight) hours as needed for nausea or vomiting. 20 tablet 0     Review of Systems  Constitutional:  Negative for chills and fever.  HENT:  Negative for congestion and rhinorrhea.   Gastrointestinal:  Negative for abdominal distention.   Physical Exam   Blood pressure 128/61, pulse (!) 110, temperature 98.4 F (36.9 C), temperature source Oral, resp. rate 16, last menstrual period 08/31/2022, SpO2 98%, unknown if currently breastfeeding.  Physical Exam Vitals reviewed.  Constitutional:      Appearance: Normal appearance.  HENT:     Head: Normocephalic and atraumatic.  Neurological:     Mental Status: She is alert.   Patient Vitals for the past 4 hrs:  BP Temp Temp src Pulse Resp SpO2  04/13/23 1920 -- -- -- -- -- 98 %  04/13/23 1915 128/61 -- -- (!) 110 -- 97 %  04/13/23 1910 -- -- -- -- -- 99 %  04/13/23 1905 -- -- -- -- -- 98 %  04/13/23 1900 129/74 -- -- (!) 102 -- 98 %  04/13/23 1855 -- -- -- -- -- 98 %  04/13/23 1850 -- -- -- -- -- 98 %  04/13/23 1845 119/77 -- -- (!) 108 -- 97 %  04/13/23 1840 -- -- -- -- -- 97 %  04/13/23 1835 -- -- -- -- -- 97 %  04/13/23 1830 107/82 -- -- (!) 115 -- 97 %  04/13/23 1825 -- -- -- -- -- 98 %  04/13/23 1820 -- -- -- -- -- 99 %  04/13/23 1815 119/85 -- -- (!) 106 -- 99 %  04/13/23 1810 -- -- -- -- -- 98 %  04/13/23 1805 -- -- -- -- -- 99 %  04/13/23 1800 126/69 -- -- (!) 115 -- 99 %  04/13/23 1755 -- -- -- -- -- 100 %  04/13/23 1750 -- -- -- -- -- 99 %  04/13/23 1745 (!) 144/86 -- -- (!) 105 -- 99 %  04/13/23 1740 -- -- -- -- -- 98 %  04/13/23 1735 -- -- -- -- -- 98 %  04/13/23 1730 127/76 -- -- (!) 115 -- 99 %  04/13/23 1725 -- -- -- -- -- 99 %  04/13/23 1720 -- -- -- -- -- 100 %  04/13/23 1715 129/78 -- -- (!) 106 -- 99 %  04/13/23 1710 --  -- -- -- -- 99 %  04/13/23 1705 -- -- -- -- -- 100 %  04/13/23 1700 (!) 143/83 -- -- 98 -- 100 %  04/13/23 1655 -- -- -- -- -- 100 %  04/13/23 1650 -- -- -- -- -- 98 %  04/13/23 1645 137/65 -- -- -- -- 98 %  04/13/23 1644 137/65 98.4 F (36.9 C) Oral (!) 112 16 99 %  04/13/23 1640 -- -- -- -- -- 99 %      Labs: Results for orders placed or performed during the hospital encounter of 04/13/23 (from the past 24 hours)  Protein / creatinine ratio, urine   Collection Time: 04/13/23  4:26 PM  Result Value Ref Range   Creatinine, Urine 110 mg/dL   Total Protein, Urine 18 mg/dL   Protein Creatinine Ratio 0.16 (H) 0.00 - 0.15 mg/mg[Cre]  CBC   Collection Time: 04/13/23  5:48 PM  Result Value Ref Range   WBC 12.0 (H) 4.0 - 10.5 K/uL   RBC 3.82 (L) 3.87 - 5.11 MIL/uL   Hemoglobin 9.6 (L) 12.0 - 15.0 g/dL   HCT 32.4 (L) 40.1 - 02.7 %   MCV 77.0 (L) 80.0 - 100.0 fL   MCH 25.1 (L) 26.0 - 34.0 pg   MCHC 32.7 30.0 - 36.0 g/dL   RDW 25.3 66.4 - 40.3 %   Platelets 265 150 - 400 K/uL   nRBC 0.0 0.0 - 0.2 %  Comprehensive metabolic panel   Collection Time: 04/13/23  5:48 PM  Result Value Ref Range   Sodium 137 135 - 145 mmol/L   Potassium 3.4 (L) 3.5 - 5.1 mmol/L   Chloride 104 98 - 111 mmol/L   CO2 21 (L) 22 - 32 mmol/L   Glucose, Bld 109 (H) 70 - 99 mg/dL   BUN 5 (L) 6 - 20 mg/dL   Creatinine, Ser 4.74 (L) 0.44 - 1.00 mg/dL   Calcium 8.9 8.9 - 25.9 mg/dL   Total Protein 6.3 (L) 6.5 - 8.1 g/dL   Albumin 2.5 (L) 3.5 - 5.0 g/dL  AST 20 15 - 41 U/L   ALT 15 0 - 44 U/L   Alkaline Phosphatase 142 (H) 38 - 126 U/L   Total Bilirubin 0.4 0.0 - 1.2 mg/dL   GFR, Estimated >21 >30 mL/min   Anion gap 12 5 - 15   Assessment: Tammy Barnett is  23 y.o. G1P0000 at [redacted]w[redacted]d presents with gestational hypertension.  Plan: Wants to transfer back to [facility].  Note sent to Front office and MDs  Plan NST in office on Monday, w/BP check Pt has BP cuff at home, continue to monitor. Has [place]  appt in [place] on Wed, keep that appt Will do our best to accommodate a late transfer Bile acids pending.   Tammy Barnett 3/6/20257:40 PM

## 2023-04-13 NOTE — MAU Note (Signed)
.  Tammy Barnett is a 23 y.o. at [redacted]w[redacted]d here in MAU reporting: Elevated BP's at home over the past two weeks, bilateral swelling of hands and feet, and reports of protein in her urinalysis yesterday in office. She also reports lower abdominal pressure with walking. Denies RUQ/epigastric pain, HA, and visual disturbances. Denies VB or LOF. +FM.  Receives [facility] with [facility] but reports she is concerned about her care received there and does not feel heard.   Onset of complaint: Two weeks Pain score: Denies current pain.  Vitals:   04/13/23 1644  BP: 137/65  Pulse: (!) 112  Resp: 16  Temp: 98.4 F (36.9 C)  SpO2: 99%      FHT: 135 initial external Lab orders placed from triage: none

## 2023-04-13 NOTE — MAU Provider Note (Signed)
 History     CSN: [id number]  Arrival date and time: 04/13/23 1617   None     Chief Complaint  Patient presents with   Hypertension   Bilateral Swelling of Hands and Feet   HPI Patient is a 23 year old G1 at 64 weeks presenting for concerns for swelling in lower extremities, increased blood pressure.  Reports that last week she took her blood pressure was in the 140s/70s.  She then had a migraine later in the week and on Tuesday took her blood pressure again and it was also in the 140s/70s.  She has also noticed considerably worsening swelling in her lower extremities over the last few weeks so she was worried about preeclampsia.  Denies any recent headaches, changes in vision, right upper quadrant pain.  She does endorse itching in her hands and feet which is worse at night.  OB History     Gravida  1   Para  0   Term  0   Preterm  0   AB  0   Living  0      SAB  0   IAB  0   Ectopic  0   Multiple  0   Live Births              Past Medical History:  Diagnosis Date   Anxiety    ASCUS with positive high risk HPV cervical 06/28/2021   06/28/21 repeat in 1 year per [facility]    Breast lump 09/10/2015   Depression    Dysmenorrhea 01/08/2014   Encounter for menstrual regulation 01/15/2014   Irregular intermenstrual bleeding 08/27/2014   Right ovarian cyst 01/15/2014   Ruptured ovarian cyst 01/08/2014   Thyroid nodule 09/10/2015   Vaginal odor 04/16/2014    History reviewed. No pertinent surgical history.  Family History  Problem Relation Age of Onset   Hypertension Father    Diabetes Paternal Grandmother    Hypertension Paternal Grandfather     Social History   Tobacco Use   Smoking status: Never   Smokeless tobacco: Never  Vaping Use   Vaping status: Every Day  Substance Use Topics   Alcohol use: Yes    Comment: occ   Drug use: No    Allergies: No Known Allergies  Medications Prior to Admission  Medication Sig Dispense Refill Last Dose/Taking    fluticasone (FLONASE) 50 MCG/ACT nasal spray Place 2 sprays into both nostrils daily. 16 g 6    ondansetron (ZOFRAN-ODT) 4 MG disintegrating tablet Take 1 tablet (4 mg total) by mouth every 8 (eight) hours as needed for nausea or vomiting. 20 tablet 0     Review of Systems  Constitutional:  Negative for chills and fever.  HENT:  Negative for congestion and rhinorrhea.   Gastrointestinal:  Negative for abdominal distention.   Physical Exam   Blood pressure (!) 144/86, pulse (!) 105, temperature 98.4 F (36.9 C), temperature source Oral, resp. rate 16, last menstrual period 08/31/2022, SpO2 99%, unknown if currently breastfeeding.  Physical Exam Vitals reviewed.  Constitutional:      Appearance: Normal appearance.  HENT:     Head: Normocephalic and atraumatic.     Nose: Nose normal.     Mouth/Throat:     Mouth: Mucous membranes are moist.  Cardiovascular:     Rate and Rhythm: Normal rate.     Pulses: Normal pulses.  Pulmonary:     Effort: Pulmonary effort is normal.  Abdominal:     Palpations: Abdomen  is soft.     Comments: Gravid  Musculoskeletal:     Right lower leg: Edema present.     Left lower leg: Edema present.  Skin:    General: Skin is warm.     Capillary Refill: Capillary refill takes less than 2 seconds.  Neurological:     General: No focal deficit present.     Mental Status: She is alert.  Psychiatric:        Mood and Affect: Mood normal.     MAU Course  Procedures  MDM CBC CMP Bile acids NST Protein to creatinine  Assessment and Plan  Tammy Barnett is a 23 yo G1 @[redacted]w[redacted]d  presenting for concern for elevated blood pressures as well as itching on hands and feet.  Elevated blood pressure affecting pregnancy Concern for cholestasis of pregnancy Patient presenting with concerns regarding her blood pressure over the last few weeks.  Reports approximately week ago she took her blood pressure it is 140s/70s.  On Tuesday was also in the 140s/70s.  Had  a terrible headache on Tuesday which did not respond to Tylenol but spontaneously resolved.  Reports that at her OB office she had a urine that showed elevated protein so she wanted to be evaluated for preeclampsia.  Initial blood pressures elevated in the 140s/80s.  PC ratio of 0.16.  CMP within normal limits, CBC within normal limits.  Blood pressure stabilized and were within normal limits throughout the rest of the visit.  [facility] so message sent [facility].  Discussed strict return precautions including headache that does not go away with Tylenol, change in vision, right upper quadrant pain, considerably worsening swelling.    [name] 04/13/2023, 6:19 PM

## 2023-04-14 LAB — BILE ACIDS, TOTAL: Bile Acids Total: 4.3 umol/L (ref 0.0–10.0)

## 2023-04-15 ENCOUNTER — Encounter (HOSPITAL_COMMUNITY): Payer: Self-pay | Admitting: Obstetrics and Gynecology

## 2023-04-15 ENCOUNTER — Inpatient Hospital Stay (HOSPITAL_COMMUNITY)

## 2023-04-15 ENCOUNTER — Inpatient Hospital Stay (HOSPITAL_COMMUNITY)
Admission: AD | Admit: 2023-04-15 | Discharge: 2023-04-15 | Disposition: A | Discharge status: Home / Self Care | Payer: Self-pay | Attending: Obstetrics and Gynecology | Admitting: Obstetrics and Gynecology

## 2023-04-15 DIAGNOSIS — R000 Tachycardia, unspecified: Secondary | ICD-10-CM | POA: Insufficient documentation

## 2023-04-15 DIAGNOSIS — R519 Headache, unspecified: Secondary | ICD-10-CM | POA: Diagnosis not present

## 2023-04-15 DIAGNOSIS — R0600 Dyspnea, unspecified: Secondary | ICD-10-CM | POA: Diagnosis not present

## 2023-04-15 DIAGNOSIS — O26893 Other specified pregnancy related conditions, third trimester: Secondary | ICD-10-CM | POA: Insufficient documentation

## 2023-04-15 DIAGNOSIS — O99343 Other mental disorders complicating pregnancy, third trimester: Secondary | ICD-10-CM | POA: Insufficient documentation

## 2023-04-15 DIAGNOSIS — Z3A34 34 weeks gestation of pregnancy: Secondary | ICD-10-CM | POA: Diagnosis not present

## 2023-04-15 DIAGNOSIS — O133 Gestational [pregnancy-induced] hypertension without significant proteinuria, third trimester: Secondary | ICD-10-CM | POA: Diagnosis not present

## 2023-04-15 DIAGNOSIS — F419 Anxiety disorder, unspecified: Secondary | ICD-10-CM | POA: Insufficient documentation

## 2023-04-15 LAB — URINALYSIS, ROUTINE W REFLEX MICROSCOPIC
Bilirubin Urine: NEGATIVE
Glucose, UA: NEGATIVE mg/dL
Hgb urine dipstick: NEGATIVE
Ketones, ur: NEGATIVE mg/dL
Leukocytes,Ua: NEGATIVE
Nitrite: NEGATIVE
Protein, ur: NEGATIVE mg/dL
Specific Gravity, Urine: 1.003 — ABNORMAL LOW (ref 1.005–1.030)
pH: 7 (ref 5.0–8.0)

## 2023-04-15 LAB — COMPREHENSIVE METABOLIC PANEL
ALT: 14 U/L (ref 0–44)
AST: 19 U/L (ref 15–41)
Albumin: 2.4 g/dL — ABNORMAL LOW (ref 3.5–5.0)
Alkaline Phosphatase: 141 U/L — ABNORMAL HIGH (ref 38–126)
Anion gap: 11 (ref 5–15)
BUN: <5 mg/dL — ABNORMAL LOW (ref 6–20)
CO2: 21 mmol/L — ABNORMAL LOW (ref 22–32)
Calcium: 9 mg/dL (ref 8.9–10.3)
Chloride: 106 mmol/L (ref 98–111)
Creatinine, Ser: 0.59 mg/dL (ref 0.44–1.00)
GFR, Estimated: >60 mL/min (ref >60)
Glucose, Bld: 102 mg/dL — ABNORMAL HIGH (ref 70–99)
Potassium: 3.4 mmol/L — ABNORMAL LOW (ref 3.5–5.1)
Sodium: 138 mmol/L (ref 135–145)
Total Bilirubin: 0.5 mg/dL (ref 0.0–1.2)
Total Protein: 6.2 g/dL — ABNORMAL LOW (ref 6.5–8.1)

## 2023-04-15 LAB — CBC
HCT: 30.4 % — ABNORMAL LOW (ref 36.0–46.0)
Hemoglobin: 9.7 g/dL — ABNORMAL LOW (ref 12.0–15.0)
MCH: 25 pg — ABNORMAL LOW (ref 26.0–34.0)
MCHC: 31.9 g/dL (ref 30.0–36.0)
MCV: 78.4 fL — ABNORMAL LOW (ref 80.0–100.0)
Platelets: 264 10*3/uL (ref 150–400)
RBC: 3.88 MIL/uL (ref 3.87–5.11)
RDW: 14.6 % (ref 11.5–15.5)
WBC: 13 10*3/uL — ABNORMAL HIGH (ref 4.0–10.5)
nRBC: 0 % (ref 0.0–0.2)

## 2023-04-15 LAB — PROTEIN / CREATININE RATIO, URINE
Creatinine, Urine: 23 mg/dL
Total Protein, Urine: <6 mg/dL

## 2023-04-15 MED ORDER — ACETAMINOPHEN-CAFFEINE 500-65 MG PO TABS: 2.0000 | ORAL_TABLET | Freq: Four times a day (QID) | ORAL | 0 refills | Status: DC | PRN

## 2023-04-15 MED ORDER — LABETALOL HCL 5 MG/ML IV SOLN: 40.0000 mg | INTRAVENOUS | Status: DC | PRN

## 2023-04-15 MED ORDER — NIFEDIPINE 10 MG PO CAPS: 10.0000 mg | ORAL_CAPSULE | ORAL | Status: DC | PRN

## 2023-04-15 MED ORDER — NIFEDIPINE 10 MG PO CAPS: 20.0000 mg | ORAL_CAPSULE | ORAL | Status: DC | PRN

## 2023-04-15 MED ORDER — ACETAMINOPHEN-CAFFEINE 500-65 MG PO TABS
2.0000 | ORAL_TABLET | Freq: Once | ORAL | Status: AC
  Administered 2023-04-15: 2 via ORAL
  Filled 2023-04-15: qty 2

## 2023-04-15 MED ORDER — LACTATED RINGERS IV SOLN: INTRAVENOUS | Status: DC

## 2023-04-15 MED ORDER — ONDANSETRON HCL 4 MG PO TABS: 4.0000 mg | ORAL_TABLET | Freq: Three times a day (TID) | ORAL | 0 refills | Status: DC | PRN

## 2023-04-15 MED ORDER — ONDANSETRON HCL 4 MG/2ML IJ SOLN
4.0000 mg | Freq: Once | INTRAMUSCULAR | Status: AC
  Administered 2023-04-15: 4 mg via INTRAVENOUS
  Filled 2023-04-15: qty 2

## 2023-04-15 MED ORDER — HYDROXYZINE HCL 25 MG PO TABS: 25.0000 mg | ORAL_TABLET | ORAL | 0 refills | Status: DC | PRN

## 2023-04-15 NOTE — MAU Note (Signed)
 Tammy Barnett is a 23 y.o. at [redacted]w[redacted]d here in MAU reporting: HBP. Pt states she was here the other day for HBP and was told to come back if her BP was high or pt had a HA or any other symptoms. Pt is here today reporting HBP at home was 143/86 and 156/95. Pt states she has a HA and nausea that started in the car on the way here. Pt states she feels SOB today as well. Pt denies LOF. Pt denies VB. +FM. Pt reports lower abdomen pain that started 2 hours ago. Pt states its cramping and sharp and shooting.   Onset of complaint: 1830 Pain score: Lower abdomen 6/10  HA 4/10 Vitals:   04/15/23 2055  BP: (!) 148/79  Pulse: (!) 110  Resp: 18  Temp: 98.3 F (36.8 C)  SpO2: 100%     FHT:158 Lab orders placed from triage:  U/A

## 2023-04-15 NOTE — MAU Provider Note (Signed)
 Chief Complaint:  Headache and Hypertension   HPI    Tammy Barnett is a 23 y.o. G1P0000 at [redacted]w[redacted]d who presents to maternity admissions reporting  recent DX of gHTN seen in MAU 04/13/23. Today she reports that she was resting most of the day and then she got up and took her BP was 143/86 at 1858 and repeat 156/95 @ 2008, new onset SOB with a HA 6/10 and she feels flushed with nausea. Denies taking any medication for her HA and she denies any RUQ pain, visual changes or vomiting.   [facility] Course: [facility] -> [facility]   Past Medical History:  Diagnosis Date   Anxiety    ASCUS with positive high risk HPV cervical 06/28/2021   06/28/21 repeat in 1 year per [facility]    Breast lump 09/10/2015   Depression    Dysmenorrhea 01/08/2014   Encounter for menstrual regulation 01/15/2014   Irregular intermenstrual bleeding 08/27/2014   Right ovarian cyst 01/15/2014   Ruptured ovarian cyst 01/08/2014   Thyroid nodule 09/10/2015   Vaginal odor 04/16/2014   OB History  Gravida Para Term Preterm AB Living  1 0 0 0 0 0  SAB IAB Ectopic Multiple Live Births  0 0 0 0     # Outcome Date GA Lbr Len/2nd Weight Sex Type Anes PTL Lv  1 Current            History reviewed. No pertinent surgical history. Family History  Problem Relation Age of Onset   Hypertension Father    Diabetes Paternal Grandmother    Hypertension Paternal Grandfather    Social History   Tobacco Use   Smoking status: Never   Smokeless tobacco: Never  Vaping Use   Vaping status: Former  Substance Use Topics   Alcohol use: Yes    Comment: occ   Drug use: No   No Known Allergies Medications Prior to Admission  Medication Sig Dispense Refill Last Dose/Taking   ondansetron (ZOFRAN-ODT) 4 MG disintegrating tablet Take 1 tablet (4 mg total) by mouth every 8 (eight) hours as needed for nausea or vomiting. 20 tablet 0 04/15/2023 Morning   fluticasone (FLONASE) 50 MCG/ACT nasal spray Place 2 sprays into both nostrils daily. 16  g 6     I have reviewed patient's Past Medical Hx, Surgical Hx, Family Hx, Social Hx, medications and allergies.   ROS  Pertinent items noted in HPI and remainder of comprehensive ROS otherwise negative.   PHYSICAL EXAM  Patient Vitals for the past 24 hrs:  BP Temp Temp src Pulse Resp SpO2 Height Weight  04/15/23 2230 (!) 140/80 -- -- (!) 104 -- 100 % -- --  04/15/23 2116 138/82 -- -- (!) 115 -- 99 % -- --  04/15/23 2055 (!) 148/79 98.3 F (36.8 C) Oral (!) 110 18 100 % 5\' 5"  (1.651 m) 109.1 kg    Constitutional: Well-developed, well-nourished female who appears very anxious and diaphoretic Cardiovascular: Tachycardiac  Respiratory: norma; effort, no problems with respiration noted, RR @ 18, Lungs BCTA B/L GI: Abd soft, non-tender, gravid, no RUQ pain illicited MS: Extremities nontender, mild lower extremity  edema, normal ROM Neurologic: Alert and oriented x 4., agitated and anxious GU: no CVA tenderness B/L Pelvic: Deferred      Fetal Tracing: @ 2200 Baseline: 145-150 Variability: moderate  Accelerations: present Decelerations:absent Toco: irregular ctx's unappreciated by patient   Labs: Results for orders placed or performed during the hospital encounter of 04/15/23 (from the past 24  hours)  Protein / creatinine ratio, urine     Status: None   Collection Time: 04/15/23  9:01 PM  Result Value Ref Range   Creatinine, Urine 23 mg/dL   Total Protein, Urine <6 mg/dL   Protein Creatinine Ratio        0.00 - 0.15 mg/mg[Cre]  Urinalysis, Routine w reflex microscopic -Urine, Random     Status: Abnormal   Collection Time: 04/15/23  9:01 PM  Result Value Ref Range   Color, Urine STRAW (A) YELLOW   APPearance CLEAR CLEAR   Specific Gravity, Urine 1.003 (L) 1.005 - 1.030   pH 7.0 5.0 - 8.0   Glucose, UA NEGATIVE NEGATIVE mg/dL   Hgb urine dipstick NEGATIVE NEGATIVE   Bilirubin Urine NEGATIVE NEGATIVE   Ketones, ur NEGATIVE NEGATIVE mg/dL   Protein, ur NEGATIVE NEGATIVE  mg/dL   Nitrite NEGATIVE NEGATIVE   Leukocytes,Ua NEGATIVE NEGATIVE  CBC     Status: Abnormal   Collection Time: 04/15/23  9:34 PM  Result Value Ref Range   WBC 13.0 (H) 4.0 - 10.5 K/uL   RBC 3.88 3.87 - 5.11 MIL/uL   Hemoglobin 9.7 (L) 12.0 - 15.0 g/dL   HCT 21.3 (L) 08.6 - 57.8 %   MCV 78.4 (L) 80.0 - 100.0 fL   MCH 25.0 (L) 26.0 - 34.0 pg   MCHC 31.9 30.0 - 36.0 g/dL   RDW 46.9 62.9 - 52.8 %   Platelets 264 150 - 400 K/uL   nRBC 0.0 0.0 - 0.2 %  Comprehensive metabolic panel     Status: Abnormal   Collection Time: 04/15/23  9:34 PM  Result Value Ref Range   Sodium 138 135 - 145 mmol/L   Potassium 3.4 (L) 3.5 - 5.1 mmol/L   Chloride 106 98 - 111 mmol/L   CO2 21 (L) 22 - 32 mmol/L   Glucose, Bld 102 (H) 70 - 99 mg/dL   BUN <5 (L) 6 - 20 mg/dL   Creatinine, Ser 4.13 0.44 - 1.00 mg/dL   Calcium 9.0 8.9 - 24.4 mg/dL   Total Protein 6.2 (L) 6.5 - 8.1 g/dL   Albumin 2.4 (L) 3.5 - 5.0 g/dL   AST 19 15 - 41 U/L   ALT 14 0 - 44 U/L   Alkaline Phosphatase 141 (H) 38 - 126 U/L   Total Bilirubin 0.5 0.0 - 1.2 mg/dL   GFR, Estimated >01 >02 mL/min   Anion gap 11 5 - 15    Imaging:  DG CHEST PORT 1 VIEW Result Date: 04/15/2023 CLINICAL DATA:  Dyspnea EXAM: PORTABLE CHEST 1 VIEW COMPARISON:  None Available. FINDINGS: Lungs volumes are small, but are symmetric and are clear. No pneumothorax or pleural effusion. Cardiac size within normal limits. Pulmonary vascularity is normal. Osseous structures are age-appropriate. No acute bone abnormality. IMPRESSION: 1. Pulmonary hypoinflation. Electronically Signed   By: Helyn Numbers M.D.   On: 04/15/2023 22:03    MDM & MAU COURSE  MDM:  HIGH  Labs: Pre E W/up - Unremarkable with no proteinuria  BP monitoring  ( MRBP's)  NST: x 1 hour Fetal tachycardia initially , Now at 2200 Cat 1 reactive after IVF Hydration  IVF's/ Cold packs  RX's ordered EKG: NM Sinus rhythm CXR: Pulmonary Hypoinflation    HA resolved 0/10 No longer  diaphoretic Patient reports feeling much better  with no evidence of PreE at this time   I have reviewed the patient chart and performed the physical exam . I have  ordered & interpreted the lab results and reviewed and interpreted the NST  Medications ordered as stated below.  A/P as described below.  Counseling and education provided and patient agreeable  with plan as described below. Verbalized understanding.    MAU Course: Orders Placed This Encounter  Procedures   DG CHEST PORT 1 VIEW   CBC   Comprehensive metabolic panel   Protein / creatinine ratio, urine   Urinalysis, Routine w reflex microscopic -Urine, Random   Notify physician (specify) Confirmatory reading of BP> 160/110 15 minutes later   Apply Hypertensive Disorders of Pregnancy Care Plan   Vital signs   Measure blood pressure   EKG 12-Lead   Discharge patient Discharge disposition: 01-Home or Self Care; Discharge patient date: 04/15/2023   Meds ordered this encounter  Medications   AND Linked Order Group    NIFEdipine (PROCARDIA) capsule 10 mg    NIFEdipine (PROCARDIA) capsule 20 mg    NIFEdipine (PROCARDIA) capsule 20 mg    labetalol (NORMODYNE) injection 40 mg   lactated ringers infusion   ondansetron (ZOFRAN) injection 4 mg   acetaminophen-caffeine (EXCEDRIN TENSION HEADACHE) 500-65 MG per tablet 2 tablet   acetaminophen-caffeine (EXCEDRIN TENSION HEADACHE) 500-65 MG TABS per tablet    Sig: Take 2 tablets by mouth 4 (four) times daily as needed (Foe headaches).    Dispense:  60 tablet    Refill:  0    Supervising Provider:   Reva Bores [[id number]]   ondansetron (ZOFRAN) 4 MG tablet    Sig: Take 1 tablet (4 mg total) by mouth every 8 (eight) hours as needed for nausea or vomiting.    Dispense:  20 tablet    Refill:  0    Supervising Provider:   Reva Bores [[id number]]   hydrOXYzine (ATARAX) 25 MG tablet    Sig: Take 1 tablet (25 mg total) by mouth every 4 (four) hours as needed for anxiety.    Dispense:  20  tablet    Refill:  0    Supervising Provider:   Reva Bores [[id number]]    ASSESSMENT   1. Gestational hypertension, third trimester   2. Anxiety   3. [redacted] weeks gestation of pregnancy   4. Tachycardia, unspecified   5. Acute nonintractable headache, unspecified headache type     PLAN  Discharge home in stable condition with return precautions. Please see AVS for detailed verbal and written education and instructions reviewed with patient. She Verbalized understanding and she agrees with the plan as described above BP check 04/17/23  Future Appointments  Date Time Provider Department Center  04/17/2023  1:30 PM [facility] NURSE [facility]  04/17/2023  1:50 PM Lazaro Arms, MD [facility]  04/20/2023  2:50 PM [facility] NURSE [facility]  04/24/2023  3:10 PM [facility] NURSE [facility]  04/24/2023  3:30 PM Myna Hidalgo, DO [facility]  04/27/2023  3:30 PM [facility] NURSE [facility]  05/01/2023 11:30 AM [facility] 2 [facility] None  05/01/2023 12:15 PM Myna Hidalgo, DO [facility]  05/04/2023  3:30 PM [facility] NURSE [facility]  05/08/2023 11:30 AM [facility] [place] [facility] None  05/08/2023 11:50 AM Lazaro Arms, MD [facility]      Allergies as of 04/15/2023   No Known Allergies      Medication List     TAKE these medications    acetaminophen-caffeine 500-65 MG Tabs per tablet Commonly known as: EXCEDRIN TENSION HEADACHE Take 2 tablets by mouth  4 (four) times daily as needed (Foe headaches).   fluticasone 50 MCG/ACT nasal spray Commonly known as: FLONASE Place 2 sprays into both nostrils daily.   hydrOXYzine 25 MG tablet Commonly known as: ATARAX Take 1 tablet (25 mg total) by mouth every 4 (four) hours as needed for anxiety.   ondansetron 4 MG disintegrating tablet Commonly known as: ZOFRAN-ODT Take 1 tablet (4 mg total) by mouth every 8 (eight) hours as needed for nausea or vomiting.   ondansetron 4 MG tablet Commonly known as:  Zofran Take 1 tablet (4 mg total) by mouth every 8 (eight) hours as needed for nausea or vomiting.        Marcell Barlow, MSN, [facility], [facility] [employer]

## 2023-04-17 ENCOUNTER — Ambulatory Visit: Admitting: Obstetrics & Gynecology

## 2023-04-17 ENCOUNTER — Encounter: Payer: Self-pay | Admitting: Obstetrics & Gynecology

## 2023-04-17 ENCOUNTER — Other Ambulatory Visit

## 2023-04-17 VITALS — BP 121/78 | HR 108 | Wt 238.0 lb

## 2023-04-17 DIAGNOSIS — O0993 Supervision of high risk pregnancy, unspecified, third trimester: Secondary | ICD-10-CM | POA: Diagnosis not present

## 2023-04-17 DIAGNOSIS — O133 Gestational [pregnancy-induced] hypertension without significant proteinuria, third trimester: Secondary | ICD-10-CM

## 2023-04-17 DIAGNOSIS — Z3A35 35 weeks gestation of pregnancy: Secondary | ICD-10-CM | POA: Diagnosis not present

## 2023-04-17 NOTE — Progress Notes (Signed)
 HIGH-RISK PREGNANCY VISIT Patient name: Tammy Barnett MRN [id number]  Date of birth: 01/01/2001 Chief Complaint:   Routine Prenatal Visit (Transfer from [facility])  History of Present Illness:   Tammy Barnett is a 23 y.o. G1P0000 female at [redacted]w[redacted]d with an Estimated Date of Delivery: 05/22/23 being seen today for ongoing management of a high-risk pregnancy complicated by gHTN based on MAU visit BPs.    Today she reports no complaints. Contractions: Not present. Vag. Bleeding: None.  Movement: Present. denies leaking of fluid.      02/14/2023    4:00 PM 12/29/2022    8:45 AM 09/01/2022    8:39 AM 03/15/2022    8:05 AM 01/05/2022    9:05 AM  Depression screen PHQ 2/9  Decreased Interest 0 0 0 0 0  Down, Depressed, Hopeless 0 0 0 0 0  PHQ - 2 Score 0 0 0 0 0  Altered sleeping 0 0 0    Tired, decreased energy 0 0 1    Change in appetite 0 0 1    Feeling bad or failure about yourself  0 0 0    Trouble concentrating 0 0 0    Moving slowly or fidgety/restless 0 0 0    Suicidal thoughts 0 0 0    PHQ-9 Score 0 0 2          12/29/2022    8:45 AM 09/01/2022    8:40 AM 06/21/2021   11:36 AM 01/29/2020   10:44 AM  GAD 7 : Generalized Anxiety Score  Nervous, Anxious, on Edge 0 0 0 0  Control/stop worrying 0 1 1 0  Worry too much - different things 0 1 1 0  Trouble relaxing 0 1 1 0  Restless 0 0 0 0  Easily annoyed or irritable 0 1 1 0  Afraid - awful might happen 0 0 0 0  Total GAD 7 Score 0 4 4 0  Anxiety Difficulty    Not difficult at all     Review of Systems:   Pertinent items are noted in HPI Denies abnormal vaginal discharge w/ itching/odor/irritation, headaches, visual changes, shortness of breath, chest pain, abdominal pain, severe nausea/vomiting, or problems with urination or bowel movements unless otherwise stated above. Pertinent History Reviewed:  Reviewed past medical,surgical, social, obstetrical and family history.  Reviewed problem list, medications and  allergies. Physical Assessment:   Vitals:   04/17/23 1327  BP: 121/78  Pulse: (!) 108  Weight: 238 lb (108 kg)  Body mass index is 39.61 kg/m.           Physical Examination:   General appearance: alert, well appearing, and in no distress  Mental status: alert, oriented to person, place, and time  Skin: warm & dry   Extremities: Edema: None    Cardiovascular: normal heart rate noted  Respiratory: normal respiratory effort, no distress  Abdomen: gravid, soft, non-tender  Pelvic: Cervical exam deferred         SSE no pooling lots of cervical mucous  Fetal Status:     Movement: Present    Fetal Surveillance Testing today: Reactive NST  Tammy Barnett is at [redacted]w[redacted]d Estimated Date of Delivery: 05/22/23  NST being performed due to gHTN  Today the NST is Reactive  Fetal Monitoring:  Baseline: 140 bpm, Variability: Good {> 6 bpm), Accelerations: Reactive, and Decelerations: Absent   reactive  The accelerations are >15 bpm and more than 2 in 20 minutes  Final diagnosis:  Reactive NST  Lazaro Arms, MD     Chaperone: Faith Rogue    No results found for this or any previous visit (from the past 24 hours).  Assessment & Plan:  High-risk pregnancy: G1P0000 at [redacted]w[redacted]d with an Estimated Date of Delivery: 05/22/23      ICD-10-CM   1. Pregnancy, supervision, high-risk, third trimester  O09.93     2. Gestational hypertension, third trimester  O13.3       Meds: No orders of the defined types were placed in this encounter.   Orders:  Orders Placed This Encounter  Procedures   Glucose tolerance, 1 hour   Glucose tolerance, 3 hours   OB RESULTS CONSOLE RPR   OB RESULTS CONSOLE HIV antibody   OB RESULTS CONSOLE Hemoglobin and hematocrit, blood   OB RESULTS CONSOLE PLATELET COUNT   OB RESULTS CONSOLE RPR   OB RESULTS CONSOLE HIV antibody   OB RESULTS CONSOLE Rubella Antibody   OB RESULTS CONSOLE Varicella zoster antibody, IgG   OB RESULTS CONSOLE Hepatitis B surface  antigen   OB RESULTS CONSOLE Hemoglobin and hematocrit, blood   OB RESULTS CONSOLE PLATELET COUNT   Hepatitis C antibody   OB RESULTS CONSOLE ABO/Rh   OB RESULTS CONSOLE Antibody Screen     Labs/procedures today: NST  Treatment Plan:  keep scheduled   Follow-up: No follow-ups on file.   Future Appointments  Date Time Provider Department Center  04/20/2023  2:50 PM [facility] NURSE [facility]  04/24/2023  3:10 PM [facility] NURSE [facility]  04/24/2023  3:30 PM Myna Hidalgo, DO [facility]  04/27/2023  3:30 PM [facility] NURSE [facility]  05/01/2023 11:30 AM [facility]G 2 [facility] None  05/01/2023 12:15 PM Myna Hidalgo, DO [facility]  05/04/2023  3:30 PM [facility] NURSE [facility]  05/08/2023 11:30 AM [facility] [place] [facility] None  05/08/2023 11:50 AM Alannis Hsia, Amaryllis Dyke, MD [facility]     Lazaro Arms  Attending Physician for the Center for [facility] 04/17/2023 2:53 PM

## 2023-04-19 DIAGNOSIS — Z3685 Encounter for antenatal screening for Streptococcus B: Secondary | ICD-10-CM | POA: Diagnosis not present

## 2023-04-19 DIAGNOSIS — Z3A35 35 weeks gestation of pregnancy: Secondary | ICD-10-CM | POA: Diagnosis not present

## 2023-04-19 DIAGNOSIS — O99213 Obesity complicating pregnancy, third trimester: Secondary | ICD-10-CM | POA: Diagnosis not present

## 2023-04-19 DIAGNOSIS — Z6841 Body Mass Index (BMI) 40.0 and over, adult: Secondary | ICD-10-CM | POA: Diagnosis not present

## 2023-04-19 DIAGNOSIS — Z3689 Encounter for other specified antenatal screening: Secondary | ICD-10-CM | POA: Diagnosis not present

## 2023-04-19 LAB — OB RESULTS CONSOLE GC/CHLAMYDIA
Chlamydia: NEGATIVE
Neisseria Gonorrhea: NEGATIVE

## 2023-04-19 LAB — OB RESULTS CONSOLE GBS: GBS: NEGATIVE

## 2023-04-20 ENCOUNTER — Ambulatory Visit (INDEPENDENT_AMBULATORY_CARE_PROVIDER_SITE_OTHER): Admitting: *Deleted

## 2023-04-20 VITALS — BP 127/73 | HR 111 | Wt 238.4 lb

## 2023-04-20 DIAGNOSIS — O133 Gestational [pregnancy-induced] hypertension without significant proteinuria, third trimester: Secondary | ICD-10-CM

## 2023-04-20 DIAGNOSIS — Z3A35 35 weeks gestation of pregnancy: Secondary | ICD-10-CM | POA: Diagnosis not present

## 2023-04-20 NOTE — Progress Notes (Signed)
   NURSE VISIT- NST  SUBJECTIVE:  Tammy Barnett is a 23 y.o. G1P0000 female at [redacted]w[redacted]d, here for a NST for pregnancy complicated by [facility].  She reports active fetal movement, contractions: none, vaginal bleeding: none, membranes: intact.   OBJECTIVE:  BP 127/73   Pulse (!) 111   Wt 238 lb 6.4 oz (108.1 kg)   LMP 08/31/2022   BMI 39.67 kg/m   Appears well, no apparent distress  No results found for this or any previous visit (from the past 24 hours).  NST: FHR baseline 140 bpm, Variability: moderate, Accelerations:present, Decelerations:  Absent= Cat 1/reactive Toco: none   ASSESSMENT: G1P0000 at [redacted]w[redacted]d with GHTN NST reactive  PLAN: EFM strip reviewed by Dr. Despina Hidden   Recommendations: keep next appointment as scheduled    Annamarie Dawley  04/20/2023 4:33 PM

## 2023-04-24 ENCOUNTER — Ambulatory Visit: Admitting: Obstetrics & Gynecology

## 2023-04-24 ENCOUNTER — Other Ambulatory Visit

## 2023-04-24 ENCOUNTER — Ambulatory Visit: Admitting: *Deleted

## 2023-04-24 ENCOUNTER — Encounter: Payer: Self-pay | Admitting: Obstetrics & Gynecology

## 2023-04-24 VITALS — BP 140/89 | HR 130 | Wt 238.8 lb

## 2023-04-24 DIAGNOSIS — O0993 Supervision of high risk pregnancy, unspecified, third trimester: Secondary | ICD-10-CM | POA: Diagnosis not present

## 2023-04-24 DIAGNOSIS — Z3A36 36 weeks gestation of pregnancy: Secondary | ICD-10-CM | POA: Diagnosis not present

## 2023-04-24 DIAGNOSIS — O0990 Supervision of high risk pregnancy, unspecified, unspecified trimester: Secondary | ICD-10-CM | POA: Insufficient documentation

## 2023-04-24 DIAGNOSIS — O133 Gestational [pregnancy-induced] hypertension without significant proteinuria, third trimester: Secondary | ICD-10-CM | POA: Diagnosis not present

## 2023-04-24 NOTE — Progress Notes (Signed)
 HIGH-RISK PREGNANCY VISIT Patient name: Tammy Barnett MRN [id number]  Date of birth: 03-26-2000 Chief Complaint:   Initial Prenatal Visit  History of Present Illness:   Tammy Barnett is a 23 y.o. G57P0000 female at [redacted]w[redacted]d with an Estimated Date of Delivery: 05/22/23 being seen today for ongoing management of a high-risk pregnancy complicated by:  Gestational HTN Late TOC- prior [facility]- all records in Care Everywhere  Today she reports no complaints.    . Denies vaginal bleeding.  Denies regular contractions .  Movement: Present. denies leaking of fluid.      04/24/2023    3:09 PM 02/14/2023    4:00 PM 12/29/2022    8:45 AM 09/01/2022    8:39 AM 03/15/2022    8:05 AM  Depression screen PHQ 2/9  Decreased Interest 0 0 0 0 0  Down, Depressed, Hopeless 0 0 0 0 0  PHQ - 2 Score 0 0 0 0 0  Altered sleeping 0 0 0 0   Tired, decreased energy 0 0 0 1   Change in appetite 0 0 0 1   Feeling bad or failure about yourself  0 0 0 0   Trouble concentrating 0 0 0 0   Moving slowly or fidgety/restless 0 0 0 0   Suicidal thoughts 0 0 0 0   PHQ-9 Score 0 0 0 2      Current Outpatient Medications  Medication Instructions   acetaminophen-caffeine (EXCEDRIN TENSION HEADACHE) 500-65 MG TABS per tablet 2 tablets, Oral, 4 times daily PRN   Ferrous Sulfate (IRON PO) 1 tablet, Daily   lactobacillus acidophilus (BACID) TABS tablet 2 tablets, 3 times daily   Prenatal Vit-Fe Fumarate-FA (MULTIVITAMIN-PRENATAL) 27-0.8 MG TABS tablet 1 tablet, Daily     Review of Systems:   Pertinent items are noted in HPI Denies abnormal vaginal discharge w/ itching/odor/irritation, headaches, visual changes, shortness of breath, chest pain, abdominal pain, severe nausea/vomiting, or problems with urination or bowel movements unless otherwise stated above. Pertinent History Reviewed:  Reviewed past medical,surgical, social, obstetrical and family history.  Reviewed problem list, medications and  allergies. Physical Assessment:   Vitals:   04/24/23 1458 04/24/23 1509  BP: (!) 150/97 (!) 140/89  Pulse: (!) 130   Weight: 238 lb 12.8 oz (108.3 kg)   Body mass index is 39.74 kg/m.           Physical Examination:   General appearance: alert, well appearing, and in no distress  Mental status: normal mood, behavior, speech, dress, motor activity, and thought processes  Skin: warm & dry   Extremities: Edema: Mild pitting, slight indentation    Cardiovascular: normal heart rate noted  Respiratory: normal respiratory effort, no distress  Abdomen: gravid, soft, non-tender  Pelvic: Cervical exam deferred         Fetal Status:     Movement: Present    Fetal Surveillance Testing today: NST  NST being performed due to Gestational HTN   Fetal Monitoring:  Baseline: 150 bpm, Variability: moderate, Accelerations: present, The accelerations are >15 bpm and more than 2 in 20 minutes, and Decelerations: Absent     Final diagnosis:   Reactive NST      Chaperone: N/A    No results found for this or any previous visit (from the past 24 hours).   Assessment & Plan:  High-risk pregnancy: G1P0000 at [redacted]w[redacted]d with an Estimated Date of Delivery: 05/22/23   1. Supervision of high risk pregnancy in third trimester (  Primary)  - POC Urinalysis Dipstick OB - [facility] AMB BABYSCRIPTS SCHEDULE OPTIMIZATION  2. [redacted] weeks gestation of pregnancy  - POC Urinalysis Dipstick OB - [facility] AMB BABYSCRIPTS SCHEDULE OPTIMIZATION  3. Gestational hypertension, third trimester BP stable, pt asymptomatic -reactive NST -scheduled for IOL @ 37wks- 3/24 -reviewed preeclampsia precautions   Meds: No orders of the defined types were placed in this encounter.   Labs/procedures today: NST  Treatment Plan:  routine OB care and as outlined abov  Reviewed: Preterm labor symptoms and general obstetric precautions including but not limited to vaginal bleeding, contractions, leaking of fluid and fetal movement were  reviewed in detail with the patient.  All questions were answered. Pt has home bp cuff. Check bp weekly, let us know if >140/90.   Follow-up: Return for as scheduled, IOL 3/24.   Future Appointments  Date Time Provider Department Center  04/24/2023  3:30 PM Myna Hidalgo, DO [facility]  04/27/2023  3:30 PM [facility] NURSE [facility]  05/01/2023 11:30 AM [facility] IMG 2 [facility] None  05/01/2023 12:15 PM Myna Hidalgo, DO [facility]  05/04/2023  3:30 PM [facility] NURSE [facility]  05/08/2023 11:30 AM [facility] [place] [facility] None  05/08/2023 11:50 AM Eure, Amaryllis Dyke, MD [facility]    Orders Placed This Encounter  Procedures   OB RESULT CONSOLE Group B Strep   OB RESULTS CONSOLE GC/Chlamydia   POC Urinalysis Dipstick OB    Myna Hidalgo, DO Attending Obstetrician & Gynecologist, Faculty Practice Center for [employer], [facility]

## 2023-04-25 ENCOUNTER — Encounter (HOSPITAL_COMMUNITY): Payer: Self-pay | Admitting: *Deleted

## 2023-04-25 ENCOUNTER — Telehealth (HOSPITAL_COMMUNITY): Payer: Self-pay | Admitting: *Deleted

## 2023-04-25 NOTE — Telephone Encounter (Signed)
 Preadmission screen

## 2023-04-26 ENCOUNTER — Other Ambulatory Visit: Payer: Self-pay | Admitting: Advanced Practice Midwife

## 2023-04-26 ENCOUNTER — Encounter: Payer: Self-pay | Admitting: Obstetrics & Gynecology

## 2023-04-27 ENCOUNTER — Ambulatory Visit: Admitting: *Deleted

## 2023-04-27 ENCOUNTER — Encounter: Payer: Self-pay | Admitting: Family Medicine

## 2023-04-27 DIAGNOSIS — Z3A36 36 weeks gestation of pregnancy: Secondary | ICD-10-CM

## 2023-04-27 DIAGNOSIS — O0993 Supervision of high risk pregnancy, unspecified, third trimester: Secondary | ICD-10-CM | POA: Diagnosis not present

## 2023-04-27 DIAGNOSIS — O133 Gestational [pregnancy-induced] hypertension without significant proteinuria, third trimester: Secondary | ICD-10-CM | POA: Diagnosis not present

## 2023-04-27 NOTE — Progress Notes (Addendum)
   NURSE VISIT- NST  SUBJECTIVE:  Tammy Barnett is a 23 y.o. G1P0000 female at [redacted]w[redacted]d, here for a NST for pregnancy complicated by [facility].  She reports decreased  fetal movement, contractions: none, vaginal bleeding: none, membranes: intact.   OBJECTIVE:  BP (!) 142/83   Pulse (!) 125   LMP 08/31/2022   Appears well, no apparent distress  No results found for this or any previous visit (from the past 24 hours).  NST: FHR baseline 155 bpm, Variability: moderate, Accelerations:present, Decelerations:  Absent= Cat 1/reactive Toco: occasional   ASSESSMENT: G1P0000 at [redacted]w[redacted]d with GHTN NST reactive  PLAN: EFM strip reviewed by Joellyn Haff, CNM, [facility]   Recommendations: keep next appointment as scheduled    Jobe Marker  04/27/2023 4:36 PM

## 2023-05-01 ENCOUNTER — Encounter: Admitting: Obstetrics & Gynecology

## 2023-05-01 ENCOUNTER — Inpatient Hospital Stay (HOSPITAL_COMMUNITY): Admitting: Anesthesiology

## 2023-05-01 ENCOUNTER — Inpatient Hospital Stay (HOSPITAL_COMMUNITY)
Admission: RE | Admit: 2023-05-01 | Discharge: 2023-05-04 | DRG: 805 | Disposition: A | Discharge status: Home / Self Care | Attending: Obstetrics and Gynecology | Admitting: Obstetrics and Gynecology

## 2023-05-01 ENCOUNTER — Other Ambulatory Visit: Payer: Self-pay

## 2023-05-01 ENCOUNTER — Inpatient Hospital Stay (HOSPITAL_COMMUNITY)

## 2023-05-01 ENCOUNTER — Encounter (HOSPITAL_COMMUNITY): Payer: Self-pay | Admitting: Obstetrics & Gynecology

## 2023-05-01 ENCOUNTER — Other Ambulatory Visit: Admitting: Radiology

## 2023-05-01 DIAGNOSIS — O9902 Anemia complicating childbirth: Secondary | ICD-10-CM | POA: Diagnosis not present

## 2023-05-01 DIAGNOSIS — O139 Gestational [pregnancy-induced] hypertension without significant proteinuria, unspecified trimester: Principal | ICD-10-CM | POA: Diagnosis present

## 2023-05-01 DIAGNOSIS — Z8249 Family history of ischemic heart disease and other diseases of the circulatory system: Secondary | ICD-10-CM

## 2023-05-01 DIAGNOSIS — O3663X0 Maternal care for excessive fetal growth, third trimester, not applicable or unspecified: Secondary | ICD-10-CM | POA: Diagnosis present

## 2023-05-01 DIAGNOSIS — Z3A37 37 weeks gestation of pregnancy: Secondary | ICD-10-CM

## 2023-05-01 DIAGNOSIS — Z8759 Personal history of other complications of pregnancy, childbirth and the puerperium: Secondary | ICD-10-CM | POA: Diagnosis present

## 2023-05-01 DIAGNOSIS — O411290 Chorioamnionitis, unspecified trimester, not applicable or unspecified: Secondary | ICD-10-CM | POA: Diagnosis not present

## 2023-05-01 DIAGNOSIS — O99214 Obesity complicating childbirth: Secondary | ICD-10-CM | POA: Diagnosis not present

## 2023-05-01 DIAGNOSIS — O0933 Supervision of pregnancy with insufficient antenatal care, third trimester: Secondary | ICD-10-CM | POA: Diagnosis not present

## 2023-05-01 DIAGNOSIS — E66813 Obesity, class 3: Secondary | ICD-10-CM | POA: Diagnosis not present

## 2023-05-01 DIAGNOSIS — O411230 Chorioamnionitis, third trimester, not applicable or unspecified: Secondary | ICD-10-CM | POA: Diagnosis not present

## 2023-05-01 DIAGNOSIS — O99019 Anemia complicating pregnancy, unspecified trimester: Secondary | ICD-10-CM | POA: Diagnosis present

## 2023-05-01 DIAGNOSIS — Z833 Family history of diabetes mellitus: Secondary | ICD-10-CM

## 2023-05-01 DIAGNOSIS — O134 Gestational [pregnancy-induced] hypertension without significant proteinuria, complicating childbirth: Principal | ICD-10-CM | POA: Diagnosis present

## 2023-05-01 DIAGNOSIS — O133 Gestational [pregnancy-induced] hypertension without significant proteinuria, third trimester: Secondary | ICD-10-CM

## 2023-05-01 LAB — COMPREHENSIVE METABOLIC PANEL
ALT: 12 U/L (ref 0–44)
AST: 25 U/L (ref 15–41)
Albumin: 2.4 g/dL — ABNORMAL LOW (ref 3.5–5.0)
Alkaline Phosphatase: 142 U/L — ABNORMAL HIGH (ref 38–126)
Anion gap: 11 (ref 5–15)
BUN: <5 mg/dL — ABNORMAL LOW (ref 6–20)
CO2: 17 mmol/L — ABNORMAL LOW (ref 22–32)
Calcium: 8.5 mg/dL — ABNORMAL LOW (ref 8.9–10.3)
Chloride: 107 mmol/L (ref 98–111)
Creatinine, Ser: 0.45 mg/dL (ref 0.44–1.00)
GFR, Estimated: >60 mL/min (ref >60)
Glucose, Bld: 104 mg/dL — ABNORMAL HIGH (ref 70–99)
Potassium: 3.5 mmol/L (ref 3.5–5.1)
Sodium: 135 mmol/L (ref 135–145)
Total Bilirubin: 0.4 mg/dL (ref 0.0–1.2)
Total Protein: 6 g/dL — ABNORMAL LOW (ref 6.5–8.1)

## 2023-05-01 LAB — RPR: RPR Ser Ql: NONREACTIVE

## 2023-05-01 LAB — CBC
HCT: 29.5 % — ABNORMAL LOW (ref 36.0–46.0)
HCT: 28.1 % — ABNORMAL LOW (ref 36.0–46.0)
Hemoglobin: 9.2 g/dL — ABNORMAL LOW (ref 12.0–15.0)
Hemoglobin: 8.9 g/dL — ABNORMAL LOW (ref 12.0–15.0)
MCH: 23.8 pg — ABNORMAL LOW (ref 26.0–34.0)
MCH: 23.8 pg — ABNORMAL LOW (ref 26.0–34.0)
MCHC: 31.2 g/dL (ref 30.0–36.0)
MCHC: 31.7 g/dL (ref 30.0–36.0)
MCV: 76.2 fL — ABNORMAL LOW (ref 80.0–100.0)
MCV: 75.1 fL — ABNORMAL LOW (ref 80.0–100.0)
Platelets: 266 10*3/uL (ref 150–400)
Platelets: 245 10*3/uL (ref 150–400)
RBC: 3.87 MIL/uL (ref 3.87–5.11)
RBC: 3.74 MIL/uL — ABNORMAL LOW (ref 3.87–5.11)
RDW: 15.3 % (ref 11.5–15.5)
RDW: 15.2 % (ref 11.5–15.5)
WBC: 11.1 10*3/uL — ABNORMAL HIGH (ref 4.0–10.5)
WBC: 13.9 10*3/uL — ABNORMAL HIGH (ref 4.0–10.5)
nRBC: 0 % (ref 0.0–0.2)
nRBC: 0 % (ref 0.0–0.2)

## 2023-05-01 LAB — PROTEIN / CREATININE RATIO, URINE
Creatinine, Urine: 67 mg/dL
Protein Creatinine Ratio: 0.1 mg/mg{creat} (ref 0.00–0.15)
Total Protein, Urine: 7 mg/dL

## 2023-05-01 LAB — TYPE AND SCREEN
ABO/RH(D): O POS
Antibody Screen: NEGATIVE

## 2023-05-01 MED ORDER — EPHEDRINE 5 MG/ML INJ: 10.0000 mg | INTRAVENOUS | Status: DC | PRN

## 2023-05-01 MED ORDER — TERBUTALINE SULFATE 1 MG/ML IJ SOLN
0.2500 mg | Freq: Once | INTRAMUSCULAR | Status: DC | PRN
  Filled 2023-05-01: qty 1

## 2023-05-01 MED ORDER — TRANEXAMIC ACID-NACL 1000-0.7 MG/100ML-% IV SOLN
1000.0000 mg | Freq: Once | INTRAVENOUS | Status: AC
  Administered 2023-05-02: 1000 mg via INTRAVENOUS
  Filled 2023-05-01: qty 100

## 2023-05-01 MED ORDER — LACTATED RINGERS IV SOLN
500.0000 mL | INTRAVENOUS | Status: AC | PRN
  Administered 2023-05-02: 500 mL via INTRAVENOUS
  Administered 2023-05-02: 1000 mL via INTRAVENOUS

## 2023-05-01 MED ORDER — OXYTOCIN-SODIUM CHLORIDE 30-0.9 UT/500ML-% IV SOLN
2.5000 [IU]/h | INTRAVENOUS | Status: DC
  Filled 2023-05-01 (×2): qty 500

## 2023-05-01 MED ORDER — ONDANSETRON HCL 4 MG/2ML IJ SOLN
4.0000 mg | Freq: Four times a day (QID) | INTRAMUSCULAR | Status: DC | PRN
  Administered 2023-05-01: 4 mg via INTRAVENOUS
  Filled 2023-05-01: qty 2

## 2023-05-01 MED ORDER — OXYTOCIN-SODIUM CHLORIDE 30-0.9 UT/500ML-% IV SOLN
1.0000 m[IU]/min | INTRAVENOUS | Status: DC
  Administered 2023-05-01: 2 m[IU]/min via INTRAVENOUS

## 2023-05-01 MED ORDER — DIPHENHYDRAMINE HCL 50 MG/ML IJ SOLN: 12.5000 mg | INTRAMUSCULAR | Status: DC | PRN

## 2023-05-01 MED ORDER — PHENYLEPHRINE 80 MCG/ML (10ML) SYRINGE FOR IV PUSH (FOR BLOOD PRESSURE SUPPORT): 80.0000 ug | PREFILLED_SYRINGE | INTRAVENOUS | Status: DC | PRN

## 2023-05-01 MED ORDER — MISOPROSTOL 50MCG HALF TABLET
50.0000 ug | ORAL_TABLET | Freq: Once | ORAL | Status: AC
  Administered 2023-05-01: 50 ug via ORAL
  Filled 2023-05-01: qty 1

## 2023-05-01 MED ORDER — FENTANYL-BUPIVACAINE-NACL 0.5-0.125-0.9 MG/250ML-% EP SOLN
12.0000 mL/h | EPIDURAL | Status: DC | PRN
  Administered 2023-05-01 – 2023-05-02 (×2): 12 mL/h via EPIDURAL
  Filled 2023-05-01 (×2): qty 250

## 2023-05-01 MED ORDER — ACETAMINOPHEN 325 MG PO TABS
650.0000 mg | ORAL_TABLET | ORAL | Status: DC | PRN
  Administered 2023-05-02: 650 mg via ORAL
  Filled 2023-05-01: qty 2

## 2023-05-01 MED ORDER — LACTATED RINGERS IV SOLN
500.0000 mL | Freq: Once | INTRAVENOUS | Status: AC
  Administered 2023-05-01: 500 mL via INTRAVENOUS

## 2023-05-01 MED ORDER — LACTATED RINGERS IV SOLN: INTRAVENOUS | Status: AC

## 2023-05-01 MED ORDER — LIDOCAINE-EPINEPHRINE (PF) 1.5 %-1:200000 IJ SOLN
INTRAMUSCULAR | Status: DC | PRN
  Administered 2023-05-01: 3 mL via EPIDURAL

## 2023-05-01 MED ORDER — OXYCODONE-ACETAMINOPHEN 5-325 MG PO TABS: 1.0000 | ORAL_TABLET | ORAL | Status: DC | PRN

## 2023-05-01 MED ORDER — FENTANYL CITRATE (PF) 100 MCG/2ML IJ SOLN
50.0000 ug | INTRAMUSCULAR | Status: DC | PRN
Start: 2023-05-01 — End: 2023-05-02
  Administered 2023-05-01 (×2): 100 ug via INTRAVENOUS
  Filled 2023-05-01 (×2): qty 2

## 2023-05-01 MED ORDER — OXYTOCIN BOLUS FROM INFUSION
333.0000 mL | Freq: Once | INTRAVENOUS | Status: AC
  Administered 2023-05-02: 333 mL via INTRAVENOUS

## 2023-05-01 MED ORDER — SOD CITRATE-CITRIC ACID 500-334 MG/5ML PO SOLN: 30.0000 mL | ORAL | Status: DC | PRN

## 2023-05-01 MED ORDER — LIDOCAINE HCL (PF) 1 % IJ SOLN
30.0000 mL | INTRAMUSCULAR | Status: DC | PRN
Start: 2023-05-01 — End: 2023-05-02

## 2023-05-01 MED ORDER — OXYCODONE-ACETAMINOPHEN 5-325 MG PO TABS: 2.0000 | ORAL_TABLET | ORAL | Status: DC | PRN

## 2023-05-01 NOTE — Anesthesia Procedure Notes (Signed)
 Epidural Patient location during procedure: OB Start time: 05/01/2023 4:31 PM End time: 05/01/2023 4:46 PM  Staffing Anesthesiologist: Lewie Loron, MD Performed: anesthesiologist   Preanesthetic Checklist Completed: patient identified, IV checked, risks and benefits discussed, monitors and equipment checked, pre-op evaluation and timeout performed  Epidural Patient position: sitting Prep: DuraPrep and site prepped and draped Patient monitoring: heart rate, continuous pulse ox and blood pressure Approach: midline Location: L3-L4 Injection technique: LOR air and LOR saline  Needle:  Needle type: Tuohy  Needle gauge: 17 G Needle length: 9 cm Needle insertion depth: 8 cm Catheter type: closed end flexible Catheter size: 19 Gauge Catheter at skin depth: 14 cm Test dose: negative and 1.5% lidocaine with Epi 1:200 K  Assessment Sensory level: T8 Events: blood not aspirated, no cerebrospinal fluid, injection not painful, no injection resistance, no paresthesia and negative IV test  Additional Notes Reason for block:procedure for pain

## 2023-05-01 NOTE — Progress Notes (Addendum)
 Labor Progress Note Tammy Barnett is a 23 y.o. G1P0000 at 101w0d presented for IOL due to gHTN  S: Doing well, comfortable w epidural  O:  BP 107/65   Pulse (!) 115   Temp (!) 100.4 F (38 C) (Axillary)   Resp 15   Ht 5\' 5"  (1.651 m)   Wt 110.8 kg   LMP 08/31/2022   SpO2 100%   BMI 40.64 kg/m  EFM: 155/mod/+a/-d  CVE: Dilation: 5 Effacement (%): 70 Station: -2 Presentation: Vertex Exam by:: Dr Lucianne Muss   A&P: 23 y.o. G1P0000 [redacted]w[redacted]d here for IOL due to gHTN #Labor: Cx essentially unchanged from last check, baby feels asynclitic. Discussed check w pt, discussed role of IUPC in helping better assess ctx strength and adjust pitocin. Pt amenable, IUPC placed, baby and mom tolerated well.  #Pain: Epidural #FWB: Cat I #GBS negative  #gHTN: PEC labs wnl, BP have been normotensive  #Anemia: Presenting HgB 9.2, plan for TXA at delivery  #Anxiety  hx trauma in childhood   Sundra Aland, MD 9:22 PM

## 2023-05-01 NOTE — Progress Notes (Signed)
 Tammy Barnett is a 23 y.o. G1P0000 at [redacted]w[redacted]d by ultrasound admitted for induction of labor due to Hypertension (gestational).  Subjective: Patient doing well. Bouncing on birthing ball. Introductions exchanged.   Objective: BP (!) 116/54   Pulse (!) 103   Temp 99.3 F (37.4 C) (Oral)   Resp 18   Ht 5\' 5"  (1.651 m)   Wt 110.8 kg   LMP 08/31/2022   BMI 40.64 kg/m  No intake/output data recorded. No intake/output data recorded.  FHT:  FHR: 135 bpm, variability: moderate,  accelerations:  Present,  decelerations:  Absent UC:   regular, every 3-5 minutes SVE:   Dilation: 1.5 Effacement (%): 70 Station: -2 Exam by:: Lamont Snowball CNM  Labs: Lab Results  Component Value Date   WBC 11.1 (H) 05/01/2023   HGB 9.2 (L) 05/01/2023   HCT 29.5 (L) 05/01/2023   MCV 76.2 (L) 05/01/2023   PLT 266 05/01/2023   Patient Vitals for the past 24 hrs:  BP Temp Temp src Pulse Resp Height Weight  05/01/23 0949 122/77 -- -- 85 18 -- --  05/01/23 0843 (!) 116/54 -- -- (!) 103 18 -- --  05/01/23 0655 135/77 99.3 F (37.4 C) Oral (!) 105 16 -- --  05/01/23 0635 -- -- -- -- -- 5\' 5"  (1.651 m) 110.8 kg     Assessment / Plan: Augmentation of labor, progressing well s/p FB and Cytotec. FB expelled, however Cytotec was given only 2 hours prior.   Labor:  Plan to start pit at the 4 hour mark if needed.  Preeclampsia:   PreE labs normal. PCR pending. BPs stable and not preE at this time.  Fetal Wellbeing:  Category I Pain Control:  IV pain meds I/D:   GBS Negative  Anticipated MOD:  NSVD  Claudette Head, CNM 05/01/2023, 9:04 AM

## 2023-05-01 NOTE — Progress Notes (Signed)
 Tammy Barnett is a 23 y.o. G1P0000 at [redacted]w[redacted]d by ultrasound admitted for induction of labor due to gestational HTN.  Subjective: Patient doing well. Up walking the halls.   Objective: BP (!) 149/80   Pulse (!) 115   Temp 98.5 F (36.9 C) (Oral)   Resp 16   Ht 5\' 5"  (1.651 m)   Wt 110.8 kg   LMP 08/31/2022   BMI 40.64 kg/m  No intake/output data recorded. No intake/output data recorded.  FHT:  FHR: 150 bpm, variability: moderate,  accelerations:  Present,  decelerations:  Absent UC:   regular, every 4 minutes SVE:   Dilation: 4 Effacement (%): 70 Station: -2 Exam by:: Ashley Mariner RNC  Labs: Lab Results  Component Value Date   WBC 13.9 (H) 05/01/2023   HGB 8.9 (L) 05/01/2023   HCT 28.1 (L) 05/01/2023   MCV 75.1 (L) 05/01/2023   PLT 245 05/01/2023   Patient Vitals for the past 24 hrs:  BP Temp Temp src Pulse Resp Height Weight  05/01/23 1256 (!) 149/80 -- -- (!) 115 16 -- --  05/01/23 1204 (!) 154/78 98.5 F (36.9 C) Oral (!) 114 17 -- --  05/01/23 1100 126/74 -- -- (!) 114 16 -- --  05/01/23 0949 122/77 -- -- 85 18 -- --  05/01/23 0843 (!) 116/54 -- -- (!) 103 18 -- --  05/01/23 0655 135/77 99.3 F (37.4 C) Oral (!) 105 16 -- --  05/01/23 0635 -- -- -- -- -- 5\' 5"  (1.651 m) 110.8 kg    Assessment / Plan: Induction of labor due to gestational hypertension,  progressing well on pitocin  Labor:  Progressing well, s/p FB. Fetal head not well applied to cervix. Encouraged patient to walk prior to AROM and start pit 2x2.   Preeclampsia:   PCR still pending  Fetal Wellbeing:  Category I Pain Control:  Labor support without medications at this time.  I/D:   Negative  Anticipated MOD:  NSVD  Claudette Head, CNM 05/01/2023, 1:29 PM

## 2023-05-01 NOTE — Anesthesia Preprocedure Evaluation (Signed)
 Anesthesia Evaluation  Patient identified by MRN, date of birth, ID band Patient awake    Reviewed: Allergy & Precautions, Patient's Chart, lab work & pertinent test results  Airway Mallampati: III  TM Distance: >3 FB Neck ROM: Full    Dental no notable dental hx. (+) Dental Advisory Given   Pulmonary neg pulmonary ROS   Pulmonary exam normal breath sounds clear to auscultation       Cardiovascular hypertension, Normal cardiovascular exam Rhythm:Regular Rate:Normal     Neuro/Psych negative neurological ROS     GI/Hepatic negative GI ROS, Neg liver ROS,,,  Endo/Other    Class 3 obesity  Renal/GU negative Renal ROS     Musculoskeletal negative musculoskeletal ROS (+)    Abdominal  (+) + obese  Peds  Hematology negative hematology ROS (+)   Anesthesia Other Findings   Reproductive/Obstetrics (+) Pregnancy                             Anesthesia Physical Anesthesia Plan Anesthesia Quick Evaluation

## 2023-05-01 NOTE — H&P (Signed)
 OBSTETRIC ADMISSION HISTORY AND PHYSICAL  Tammy Barnett is a 23 y.o. female G1P0000 with IUP at [redacted]w[redacted]d by [place] at 10 weeks presenting for IOL for GHTN. She reports +FMs, No LOF, no VB, no blurry vision, headaches or peripheral edema, and RUQ pain.  She plans on breast feeding. She request Mirena IUD for birth control. She received her prenatal care at [facility]   Dating: By [place] --->  Estimated Date of Delivery: 05/22/23  Sono:    @[redacted]w[redacted]d , CWD, normal anatomy, cephalic presentation, posterior high placental lie, 323g, 38% EFW   Prenatal History/Complications: GHTN, late transfer of care from [facility]  Past Medical History: Past Medical History:  Diagnosis Date   Anxiety    ASCUS with positive high risk HPV cervical 06/28/2021   06/28/21 repeat in 1 year per [facility]    Breast lump 09/10/2015   Depression    Dysmenorrhea 01/08/2014   Encounter for menstrual regulation 01/15/2014   Irregular intermenstrual bleeding 08/27/2014   Right ovarian cyst 01/15/2014   Ruptured ovarian cyst 01/08/2014   Thyroid nodule 09/10/2015    Past Surgical History: History reviewed. No pertinent surgical history.  Obstetrical History: OB History     Gravida  1   Para  0   Term  0   Preterm  0   AB  0   Living  0      SAB  0   IAB  0   Ectopic  0   Multiple  0   Live Births              Social History Social History   Socioeconomic History   Marital status: Single    Spouse name: Not on file   Number of children: Not on file   Years of education: Not on file   Highest education level: Associate degree: occupational, Scientist, product/process development, or vocational program  Occupational History   Not on file  Tobacco Use   Smoking status: Never   Smokeless tobacco: Never  Vaping Use   Vaping status: Former  Substance and Sexual Activity   Alcohol use: Not Currently    Comment: occ   Drug use: No   Sexual activity: Yes    Birth control/protection: None  Other Topics Concern   Not on  file  Social History Narrative   Not on file   Social Drivers of Health   Financial Resource Strain: Low Risk  (04/24/2023)   Overall Financial Resource Strain (CARDIA)    Difficulty of Paying Living Expenses: Not hard at all  Food Insecurity: No Food Insecurity (05/01/2023)   Hunger Vital Sign    Worried About Running Out of Food in the Last Year: Never true    Ran Out of Food in the Last Year: Never true  Transportation Needs: No Transportation Needs (05/01/2023)   PRAPARE - Administrator, Civil Service (Medical): No    Lack of Transportation (Non-Medical): No  Physical Activity: Inactive (04/24/2023)   Exercise Vital Sign    Days of Exercise per Week: 0 days    Minutes of Exercise per Session: 0 min  Stress: No Stress Concern Present (04/24/2023)   [employer] of Occupational Health - Occupational Stress Questionnaire    Feeling of Stress : Not at all  Social Connections: Moderately Isolated (04/24/2023)   Social Connection and Isolation Panel [NHANES]    Frequency of Communication with Friends and Family: More than three times a week    Frequency of Social Gatherings  with Friends and Family: Twice a week    Attends Religious Services: 1 to 4 times per year    Active Member of [employer] or Organizations: No    Attends Engineer, structural: Never    Marital Status: Never married    Family History: Family History  Problem Relation Age of Onset   Hypertension Father    Diabetes Paternal Grandmother    Hypertension Paternal Grandfather     Allergies: No Known Allergies  Medications Prior to Admission  Medication Sig Dispense Refill Last Dose/Taking   lactobacillus acidophilus (BACID) TABS tablet Take 2 tablets by mouth 3 (three) times daily.   04/30/2023   Prenatal Vit-Fe Fumarate-FA (MULTIVITAMIN-PRENATAL) 27-0.8 MG TABS tablet Take 1 tablet by mouth daily at 12 noon.   04/30/2023   Ferrous Sulfate (IRON PO) Take 1 tablet by mouth daily. (Patient not  taking: Reported on 04/24/2023)        Review of Systems   All systems reviewed and negative except as stated in HPI  Blood pressure 135/77, pulse (!) 105, temperature 99.3 F (37.4 C), temperature source Oral, resp. rate 16, height 5\' 5"  (1.651 m), weight 110.8 kg, last menstrual period 08/31/2022, unknown if currently breastfeeding. General appearance: alert, cooperative, and appears stated age Lungs: clear to auscultation bilaterally Heart: regular rate and rhythm Abdomen: soft, non-tender; bowel sounds normal Pelvic: adequate   Extremities: Homans sign is negative, no sign of DVT DTR's +1   Presentation: cephalic Fetal monitoringBaseline: 150 bpm, Variability: Good {> 6 bpm), Accelerations: Reactive, and Decelerations: Absent Uterine activity:  Irregular contractions   Prenatal labs: ABO, Rh: O/Positive/-- (09/19 0000) Antibody: Negative (09/19 0000) Rubella: Immune (09/19 0000) RPR: Nonreactive (01/16 0000)  HBsAg: Negative (09/19 0000)  HIV: Non-reactive (01/16 0000)  GBS: Negative/-- (03/12 0000)    Lab Results  Component Value Date   GBS Negative 04/19/2023   GTT WNL Genetic screening  low risk female Anatomy US WNL  Immunization History  Administered Date(s) Administered   DTaP 05/29/2000, 07/20/2000, 09/18/2000, 09/30/2005   HIB (PRP-OMP) 05/29/2000, 07/20/2000, 09/18/2000, 03/20/2001   Hepatitis A 11/17/2009, 10/29/2010   Hepatitis B 03/02/00, 04/25/2000, 12/22/2000   IPV 05/29/2000, 07/20/2000, 12/22/2000, 09/30/2005   MMR 03/20/2001, 09/30/2005   Meningococcal Conjugate 12/28/2011   Pneumococcal Conjugate-13 05/29/2000, 07/20/2000, 09/18/2000   Tdap 06/08/2011   Varicella 03/20/2001, 09/30/2009    Prenatal Transfer Tool  Maternal Diabetes: No Genetic Screening: Normal Maternal Ultrasounds/Referrals: Normal Fetal Ultrasounds or other Referrals:  Referred to Materal Fetal Medicine  Maternal Substance Abuse:  No Significant Maternal Medications:   None Significant Maternal Lab Results: Group B Strep negative Number of Prenatal Visits:greater than 3 verified prenatal visits Maternal Vaccinations: declined Other Comments:  None   No results found for this or any previous visit (from the past 24 hours).  Patient Active Problem List   Diagnosis Date Noted   Gestational hypertension 05/01/2023   Supervision of high-risk pregnancy 04/24/2023   Gestational hypertension, third trimester 04/13/2023   Viral URI 02/14/2023   Boil 04/27/2022   ASCUS with positive high risk HPV cervical 06/28/2021   Sleep disturbance 06/21/2021   Trauma in childhood 11/20/2020   Anxiety 04/11/2017   Thyroid nodule 09/10/2015    Assessment/Plan:  Sianna D Jersey is a 23 y.o. G1P0000 at [redacted]w[redacted]d here for IOL for GHTN  #Labor:IOL for GHTN in latent phase, RBA of foley balloon and cytotec discussed with patient. Patient desires both interventions. FB placed easily, patient tolerated well.  #Pain: Coping well,  epidural on request #FWB: Cat 1 #GBS status:  negative #Feeding: Breastmilk  #Reproductive Life planning: IUD Mirena #Circ:  not applicable  Richardson Landry, CNM  05/01/2023, 7:16 AM

## 2023-05-02 ENCOUNTER — Encounter (HOSPITAL_COMMUNITY): Payer: Self-pay | Admitting: Obstetrics & Gynecology

## 2023-05-02 DIAGNOSIS — O134 Gestational [pregnancy-induced] hypertension without significant proteinuria, complicating childbirth: Secondary | ICD-10-CM | POA: Diagnosis not present

## 2023-05-02 DIAGNOSIS — O99019 Anemia complicating pregnancy, unspecified trimester: Secondary | ICD-10-CM | POA: Diagnosis present

## 2023-05-02 DIAGNOSIS — O411230 Chorioamnionitis, third trimester, not applicable or unspecified: Secondary | ICD-10-CM | POA: Diagnosis not present

## 2023-05-02 DIAGNOSIS — Z8759 Personal history of other complications of pregnancy, childbirth and the puerperium: Secondary | ICD-10-CM | POA: Diagnosis not present

## 2023-05-02 DIAGNOSIS — O411290 Chorioamnionitis, unspecified trimester, not applicable or unspecified: Secondary | ICD-10-CM | POA: Diagnosis not present

## 2023-05-02 DIAGNOSIS — O3663X0 Maternal care for excessive fetal growth, third trimester, not applicable or unspecified: Secondary | ICD-10-CM | POA: Diagnosis not present

## 2023-05-02 DIAGNOSIS — O139 Gestational [pregnancy-induced] hypertension without significant proteinuria, unspecified trimester: Secondary | ICD-10-CM | POA: Diagnosis not present

## 2023-05-02 DIAGNOSIS — O0933 Supervision of pregnancy with insufficient antenatal care, third trimester: Secondary | ICD-10-CM | POA: Diagnosis not present

## 2023-05-02 DIAGNOSIS — Z3A37 37 weeks gestation of pregnancy: Secondary | ICD-10-CM | POA: Diagnosis not present

## 2023-05-02 MED ORDER — DIPHENHYDRAMINE HCL 25 MG PO CAPS: 25.0000 mg | ORAL_CAPSULE | Freq: Four times a day (QID) | ORAL | Status: DC | PRN

## 2023-05-02 MED ORDER — TETANUS-DIPHTH-ACELL PERTUSSIS 5-2.5-18.5 LF-MCG/0.5 IM SUSY: 0.5000 mL | PREFILLED_SYRINGE | Freq: Once | INTRAMUSCULAR | Status: DC

## 2023-05-02 MED ORDER — IBUPROFEN 600 MG PO TABS
600.0000 mg | ORAL_TABLET | Freq: Four times a day (QID) | ORAL | Status: DC
  Administered 2023-05-02 – 2023-05-04 (×7): 600 mg via ORAL
  Filled 2023-05-02 (×7): qty 1

## 2023-05-02 MED ORDER — WITCH HAZEL-GLYCERIN EX PADS: 1.0000 | MEDICATED_PAD | CUTANEOUS | Status: DC | PRN

## 2023-05-02 MED ORDER — SIMETHICONE 80 MG PO CHEW: 80.0000 mg | CHEWABLE_TABLET | ORAL | Status: DC | PRN

## 2023-05-02 MED ORDER — SODIUM CHLORIDE 0.9 % IV SOLN
2.0000 g | Freq: Four times a day (QID) | INTRAVENOUS | Status: DC
  Administered 2023-05-02 (×2): 2 g via INTRAVENOUS
  Filled 2023-05-02 (×2): qty 2000

## 2023-05-02 MED ORDER — ONDANSETRON HCL 4 MG/2ML IJ SOLN: 4.0000 mg | INTRAMUSCULAR | Status: DC | PRN

## 2023-05-02 MED ORDER — GENTAMICIN SULFATE 40 MG/ML IJ SOLN
5.0000 mg/kg | INTRAVENOUS | Status: DC
  Administered 2023-05-02: 390 mg via INTRAVENOUS
  Filled 2023-05-02 (×2): qty 9.75

## 2023-05-02 MED ORDER — BENZOCAINE-MENTHOL 20-0.5 % EX AERO
1.0000 | INHALATION_SPRAY | CUTANEOUS | Status: DC | PRN
  Administered 2023-05-02: 1 via TOPICAL
  Filled 2023-05-02: qty 56

## 2023-05-02 MED ORDER — OXYCODONE HCL 5 MG PO TABS: 10.0000 mg | ORAL_TABLET | ORAL | Status: DC | PRN

## 2023-05-02 MED ORDER — ONDANSETRON HCL 4 MG PO TABS: 4.0000 mg | ORAL_TABLET | ORAL | Status: DC | PRN

## 2023-05-02 MED ORDER — ZOLPIDEM TARTRATE 5 MG PO TABS: 5.0000 mg | ORAL_TABLET | Freq: Every evening | ORAL | Status: DC | PRN

## 2023-05-02 MED ORDER — PRENATAL MULTIVITAMIN CH
1.0000 | ORAL_TABLET | Freq: Every day | ORAL | Status: DC
  Administered 2023-05-03: 1 via ORAL
  Filled 2023-05-02: qty 1

## 2023-05-02 MED ORDER — DIBUCAINE (PERIANAL) 1 % EX OINT: 1.0000 | TOPICAL_OINTMENT | CUTANEOUS | Status: DC | PRN

## 2023-05-02 MED ORDER — SENNOSIDES-DOCUSATE SODIUM 8.6-50 MG PO TABS
2.0000 | ORAL_TABLET | Freq: Every day | ORAL | Status: DC
  Administered 2023-05-03 – 2023-05-04 (×2): 2 via ORAL
  Filled 2023-05-02 (×2): qty 2

## 2023-05-02 MED ORDER — ACETAMINOPHEN 500 MG PO TABS
1000.0000 mg | ORAL_TABLET | Freq: Once | ORAL | Status: AC
  Administered 2023-05-02: 1000 mg via ORAL
  Filled 2023-05-02: qty 2

## 2023-05-02 MED ORDER — COCONUT OIL OIL: 1.0000 | TOPICAL_OIL | Status: DC | PRN

## 2023-05-02 MED ORDER — ACETAMINOPHEN 325 MG PO TABS: 650.0000 mg | ORAL_TABLET | ORAL | Status: DC | PRN

## 2023-05-02 MED ORDER — OXYCODONE HCL 5 MG PO TABS: 5.0000 mg | ORAL_TABLET | ORAL | Status: DC | PRN

## 2023-05-02 NOTE — Progress Notes (Signed)
 LABOR PROGRESS NOTE  Patient Name: Tammy Barnett, female   DOB: [birth year], 23 y.o.  MRN: [id number]  G1P0000 at [redacted]w[redacted]d admitted for IOL for gHTN  S: Pushing with great effort and fetal movement.   O:  BP (!) 141/83   Pulse (!) 116   Temp 98.7 F (37.1 C) (Oral)   Resp 16   Ht 5\' 5"  (1.651 m)   Wt 110.8 kg   LMP 08/31/2022   SpO2 100%   BMI 40.64 kg/m  EFM:150 bpm/Moderate variability/ 15x15 accels/ None decels CAT: 1 Toco: regular, every 2-3 minutes   CVE: Dilation: 10 Dilation Complete Date: 05/02/23 Dilation Complete Time: 1010 Effacement (%): 100 Cervical Position: Middle Station: 0 Presentation: Vertex Exam by:: Ashley Mariner   A&P:   #Labor: Progressing well. Anticipate vaginal delivery #Pain: Epidural #FWB: CAT 1 #GBS negative #Anticipate vaginal delivery #gHTN: mild range pressures, normal labs  Wyn Forster, MD FMOB Fellow, Faculty practice [facility], Center for [facility] 05/02/23  10:11 AM

## 2023-05-02 NOTE — Lactation Note (Signed)
 This note was copied from a baby's chart. Lactation Consultation Note  Patient Name: Tammy Barnett [id number] Date: 05/02/2023 Age:23 hours   Infant had just fed at 5:30 pm. [name] stated that infant had latched well but also come on on off of the breast within minutes of latching. [name] did say that infant has shoulder dystocia. I advised that infant may have come off and on due to that shoulder dystocia. [name] didn't remember which shoulder had the dystocia and infant was asleep. Advised [name] to note which shoulder has the dystocia or ask RN and to try positioning infant on the opposite shoulder. LC educated mom on infant stomach size, input/output, infant feeding cues, infant behaviors within the first 24 hours. LC student showed [name] how to hand express. [name] taught back and was able to express a few drops of colostrum which LC spoon fed back to infant. [name] knows to call for lactation when it's time for the next feeding.    Current Feeding Plan: Exclusive breastfeeding.  LATCH Score: latch not assessed.                    Lolly Mustache, Lactation Student 05/02/2023, 7:10 PM

## 2023-05-02 NOTE — Progress Notes (Signed)
 Labor Progress Note Tammy Barnett is a 23 y.o. G1P0000 at [redacted]w[redacted]d presented for IOL due to gHTN  In to assess Tammy Barnett -- Tammy Barnett reporting much more pressure, more uncomfortable. Also FHR noted to be in 160s with prolonged accels into the 200s. This started after cervical exam. Otherwise moderate variability and overall reassuring FHT. Cervix rechecked and Tammy Barnett progressing -- now 7cm. Suspect that FHR due to scalp stim. Will bolus IV fluids given FHR trending higher, likely 2/2 underlying chorio. Tammy Barnett's ctx also noted to be q1 min, will decrease pitocin to 10. Anticipate SVD soon.  Sundra Aland, MD OB Fellow, Faculty Practice [facility], Center for [facility]

## 2023-05-02 NOTE — Consult Note (Signed)
 Tammy Barnett is a 23 y.o. female admitted on 05/01/2023  6:12 AM  with  IOL due to gHTN . Pharmacy has been consulted for Gentamicin dosing.  Plan: Gentamicin 5 mg/kg q24h  Ht Readings from Last 1 Encounters:  05/01/23 5\' 5"  (1.651 m)    110.8 kg (244 lb 3.2 oz)  Ideal body weight: 57 kg (125 lb 10.6 oz) Adjusted ideal body weight: 78.5 kg (173 lb 1.2 oz)   Temp: 100.5 F (38.1 C) (03/25 0120) Temp Source: Oral (03/25 0120) BP: 117/65 (03/25 0130) Pulse Rate: 115 (03/25 0130)   Recent Labs    05/01/23 0639 05/01/23 1208  WBC 11.1* 13.9*  CREATININE 0.45  --    Estimated Creatinine Clearance: 135.5 mL/min (by C-G formula based on SCr of 0.45 mg/dL).  Allergies: Patient has no known allergies.   Antimicrobials this admission: Ampicillin 3/25 >>  Gentamicin 3/25 >>    Thank you for allowing pharmacy to be a part of this patient's care.  Minda Meo 05/02/2023 1:34 AM

## 2023-05-02 NOTE — Progress Notes (Signed)
 Labor Progress Note Tanith D Taite is a 23 y.o. G1P0000 at [redacted]w[redacted]d presented for IOL due to gHTN  Pt with new onset fever, fetal tachycardia. Cat I tracing, though FHR baseline now 155-160s. MVUs 185-195. Cx thinner than previous check with descent in station. Continue Pitocin. Discussed trial of Miles circuit to see if this helps with asynclitic presentation. Tylenol, Amp, Gent ordered for chorio. Pt did want PP IUD, but will now defer given development of chorio.  Sundra Aland, MD OB Fellow, Faculty Practice [facility], Center for [facility]

## 2023-05-02 NOTE — Discharge Summary (Signed)
 Postpartum Discharge Summary  Date of Service updated***     Patient Name: Tammy Barnett DOB: 06-Jan-2001 MRN: [id number]  Date of admission: 05/01/2023 Delivery date:05/02/2023 Delivering provider: Wyn Forster Date of discharge: 05/02/2023  Admitting diagnosis: Gestational hypertension [O13.9] Intrauterine pregnancy: [redacted]w[redacted]d     Secondary diagnosis:  Principal Problem:   Gestational hypertension Active Problems:   Anemia in pregnancy   Chorioamnionitis   History of shoulder dystocia in prior pregnancy   NSVD (normal spontaneous vaginal delivery)   Shoulder dystocia during labor and delivery, delivered   Obstetrical laceration, second degree  Additional problems: ***    Discharge diagnosis: Term Pregnancy Delivered and Gestational Hypertension                                              Post partum procedures:{Postpartum procedures:23558} Augmentation: AROM, Pitocin, Cytotec, and IP Foley Complications: Intrauterine Inflammation or infection (Chorioamniotis)  Hospital course: Induction of Labor With Vaginal Delivery   23 y.o. yo G1P0000 at [redacted]w[redacted]d was admitted to the hospital 05/01/2023 for induction of labor.  Indication for induction: Gestational hypertension.  Patient had an labor course complicated by development of chorioamniotis treated with ampicillin and gentimycin.  Membrane Rupture Time/Date: 5:45 PM,05/01/2023  Delivery Method:  Operative Delivery:N/A Episiotomy:   Lacerations:    Details of delivery can be found in separate delivery note.  Patient had a postpartum course complicated by 75 second shoulder dystocia, relived by Mc[place]ts, Suprapubic pressure and delivery of the posterior axilla. Umbical artery gas: 7.38. Patient is discharged home 05/02/23.  Newborn Data: Birth date:05/02/2023 Birth time:12:35 PM Gender:Female Living status:Living Apgars: ,8  Weight:   Magnesium Sulfate received: {Mag received:30440022} BMZ received:  No Rhophylac:N/A MMR:Yes T-DaP:{Tdap:23962} Flu: {WUJ:81191} RSV Vaccine received: {RSV:31013} Transfusion:{Transfusion received:30440034}  Immunizations received: Immunization History  Administered Date(s) Administered   DTaP 05/29/2000, 07/20/2000, 09/18/2000, 09/30/2005   HIB (PRP-OMP) 05/29/2000, 07/20/2000, 09/18/2000, 03/20/2001   Hepatitis A 11/17/2009, 10/29/2010   Hepatitis B 09-27-2000, 04/25/2000, 12/22/2000   IPV 05/29/2000, 07/20/2000, 12/22/2000, 09/30/2005   MMR 03/20/2001, 09/30/2005   Meningococcal Conjugate 12/28/2011   Pneumococcal Conjugate-13 05/29/2000, 07/20/2000, 09/18/2000   Tdap 06/08/2011   Varicella 03/20/2001, 09/30/2009    Physical exam  Vitals:   05/02/23 0930 05/02/23 1100 05/02/23 1200 05/02/23 1239  BP: (!) 141/83 127/61  (!) 139/45  Pulse: (!) 116 (!) 107  (!) 132  Resp: 16 18    Temp: 98.7 F (37.1 C)  99.8 F (37.7 C)   TempSrc: Oral  Oral   SpO2:      Weight:      Height:       General: {Exam; general:21111117} Lochia: {Desc; appropriate/inappropriate:30686::"appropriate"} Uterine Fundus: {Desc; firm/soft:30687} Incision: {Exam; incision:21111123} DVT Evaluation: {Exam; YNW:2956213} Labs: Lab Results  Component Value Date   WBC 13.9 (H) 05/01/2023   HGB 8.9 (L) 05/01/2023   HCT 28.1 (L) 05/01/2023   MCV 75.1 (L) 05/01/2023   PLT 245 05/01/2023      Latest Ref Rng & Units 05/01/2023    6:39 AM  CMP  Glucose 70 - 99 mg/dL 086   BUN 6 - 20 mg/dL <5   Creatinine 5.78 - 1.00 mg/dL 4.69   Sodium 629 - 528 mmol/L 135   Potassium 3.5 - 5.1 mmol/L 3.5   Chloride 98 - 111 mmol/L 107   CO2 22 - 32 mmol/L 17  Calcium 8.9 - 10.3 mg/dL 8.5   Total Protein 6.5 - 8.1 g/dL 6.0   Total Bilirubin 0.0 - 1.2 mg/dL 0.4   Alkaline Phos 38 - 126 U/L 142   AST 15 - 41 U/L 25   ALT 0 - 44 U/L 12    Edinburgh Score:     No data to display         No data recorded  After visit meds:  Allergies as of 05/02/2023   No Known  Allergies   Med Rec must be completed prior to using this [facility]***        Discharge home in stable condition Infant Feeding: {Baby feeding:23562} Infant Disposition:{CHL IP OB HOME WITH QIHKVQ:25956} Discharge instruction: per After Visit Summary and Postpartum booklet. Activity: Advance as tolerated. Pelvic rest for 6 weeks.  Diet: routine diet Future Appointments:No future appointments. Follow up Visit:  Follow-up Information     [place] [facility] at [facility]. Schedule an appointment as soon as possible for a visit.   Specialty: Obstetrics and Gynecology Why: Routine postpartum follow up in 4-6 weeks Contact information: [address] [place] [address] [phone]        [facility] at [facility] Follow up in 1 week(s).   Specialty: Obstetrics and Gynecology Why: Post partum blood pressure check Contact information: [address] [place] [address] [phone]               Message sent to FT 3/25  Please schedule this patient for a In person postpartum visit in 4 weeks with the following provider: Any provider. Additional Postpartum F/U:Postpartum Depression checkup, 2 hour GTT, and BP check 1 week  High risk pregnancy complicated by: HTN and abnormal 1hr OGTT (normal 3hr) Delivery mode:    Anticipated Birth Control:   Postpartum IUD- Mirena out patient   05/02/2023 Wyn Forster, MD

## 2023-05-02 NOTE — Progress Notes (Signed)
 Labor Progress Note Tammy Barnett is a 23 y.o. G1P0000 at [redacted]w[redacted]d presented for IOL due to gHTN  Strip improved w changes as documented in prior progress note (decr Pit, IV fluids). Cat I tracing. Discussed with bedside RN to begin uptitrating Pit to ensure adequate ctx.   BP have been normotensive to mild range.   Sundra Aland, MD OB Fellow, Faculty Practice [facility], Center for [facility]

## 2023-05-03 LAB — CBC
HCT: 27.6 % — ABNORMAL LOW (ref 36.0–46.0)
Hemoglobin: 8.8 g/dL — ABNORMAL LOW (ref 12.0–15.0)
MCH: 24 pg — ABNORMAL LOW (ref 26.0–34.0)
MCHC: 31.9 g/dL (ref 30.0–36.0)
MCV: 75.4 fL — ABNORMAL LOW (ref 80.0–100.0)
Platelets: 198 10*3/uL (ref 150–400)
RBC: 3.66 MIL/uL — ABNORMAL LOW (ref 3.87–5.11)
RDW: 15.4 % (ref 11.5–15.5)
WBC: 15.2 10*3/uL — ABNORMAL HIGH (ref 4.0–10.5)
nRBC: 0 % (ref 0.0–0.2)

## 2023-05-03 MED ORDER — POLYETHYLENE GLYCOL 3350 17 G PO PACK
17.0000 g | PACK | Freq: Every day | ORAL | Status: DC | PRN
  Administered 2023-05-03: 17 g via ORAL
  Filled 2023-05-03: qty 1

## 2023-05-03 NOTE — Addendum Note (Signed)
 Addendum  created 05/03/23 1220 by Lewie Loron, MD   Clinical Note Signed

## 2023-05-03 NOTE — Lactation Note (Signed)
 This note was copied from a baby's chart. Lactation Consultation Note  Patient Name: Tammy Barnett [id number] Date: 05/03/2023 Age:23 hours, NP order , nurse request , 2nd LC visit . 1st mom was in the shower  Reason for consult: Follow-up assessment;Primapara;1st time breastfeeding;Difficult latch;Early term 37-38.6wks;Breastfeeding assistance, shoulder dystocia  [name] reported the latches have been on and off.  LC discussed since the baby is soon to be 24 hours old and still not consistently latching for 10 mins or greater, baby is due for some calories, The MBU Nurse Dorene Grebe had mom sign the donor milk consent.  LC able to wake the baby up, and pace feed 17 ml of donor milk 1st to perk the baby up to latch. Baby STS and  latched after several attempts for a few sucks and then back to sleep.  Areolas compressible and able to express a few drops.  No tongue restrictions noted and baby tolerated the yellow nipple well.  LC plan :  Feed with cues and by 3 hours STS , if baby well awake over the breast 1st and if not wake up well to pace feed and appetizer of EBM or donor milk and then latch for now in the football position,  Feed for 15 -20 mins or greater, supplement 20 -30 ml of Expressed milk or donor milk and post pump for 15 mins , save the milk for the next feeding.  Next feeding switch and latch on the other breast.   LC reassurance parents this is not unusual when the baby had a long labor and pushed 3 hours. ( Mom mentioned that)  Mom aware to call for assistance from the nurse or LC.   Maternal Data Has patient been taught Hand Expression?: Yes (small drops)  Feeding Mother's Current Feeding Choice: Breast Milk and Donor Milk  LATCH Score Latch: Repeated attempts needed to sustain latch, nipple held in mouth throughout feeding, stimulation needed to elicit sucking reflex.  Audible Swallowing: None  Type of Nipple: Everted at rest and after stimulation (areola  compressible)  Comfort (Breast/Nipple): Soft / non-tender  Hold (Positioning): Full assist, staff holds infant at breast  LATCH Score: 5   Lactation Tools Discussed/Used Tools: Pump;Flanges Flange Size: 21 Breast pump type: Manual;Double-Electric Breast Pump Pump Education: Setup, frequency, and cleaning;Milk Storage Reason for Pumping: due to DL Pumping frequency: after attempt or feeding at the breast and supplementing with donor milk Pumped volume: 2 mL  Interventions Interventions: Breast feeding basics reviewed;Assisted with latch;Skin to skin;Breast massage;Hand express;Reverse pressure;Breast compression;Adjust position;Support pillows;Position options;Coconut oil;Hand pump;DEBP;Education;Pace feeding;LC Services brochure;CDC milk storage guidelines;CDC Guidelines for Breast Pump Cleaning  Discharge Pump: Personal;Hands Free;Manual  Consult Status Consult Status: Follow-up Date: 05/03/23 Follow-up type: In-patient    Matilde Sprang Tammy Barnett 05/03/2023, 12:24 PM

## 2023-05-03 NOTE — Anesthesia Postprocedure Evaluation (Signed)
 Anesthesia Post Note  Patient: Tammy Barnett  Procedure(s) Performed: AN AD HOC LABOR EPIDURAL     Patient location during evaluation: Mother Baby Anesthesia Type: Epidural Level of consciousness: awake and alert Pain management: pain level controlled Vital Signs Assessment: post-procedure vital signs reviewed and stable Respiratory status: spontaneous breathing, nonlabored ventilation and respiratory function stable Cardiovascular status: stable Postop Assessment: no headache, no backache and epidural receding Anesthetic complications: no   No notable events documented.  Last Vitals:  Vitals:   05/02/23 2315 05/03/23 0330  BP: 118/73 119/73  Pulse: 96 87  Resp: 18 16  Temp: 36.4 C 36.7 C  SpO2: 100% 100%    Last Pain:  Vitals:   05/03/23 0413  TempSrc:   PainSc: 4    Pain Goal:                   [name]

## 2023-05-03 NOTE — Plan of Care (Signed)

## 2023-05-03 NOTE — Lactation Note (Signed)
 This note was copied from a baby's chart. Lactation Consultation Note  Patient Name: Tammy Barnett [id number] Date: 05/03/2023 Age:23 years P1  Reason for consult: Follow-up assessment;Mother's request;Primapara;1st time breastfeeding;Early term 37-38.6wks;Breastfeeding assistance [facility] nurse mentioned she had recently tried to latch the baby and it was on and off.) As LC entered the room per  mom had recently attempted.  LC offered to assist and mom receptive, 1st attempted the cross cradle and baby on and off,. Switched the baby to the football , baby more awake and latched for 2-3 ,minutes with swallows and then released.  LC worked with baby for several latches and the baby acted like she didn't want to eat. LC placed baby on moms chest. Mom aware of the [facility] plan from the previous consult, and then donor milk will be coming.   Maternal Data Has patient been taught Hand Expression?: Yes (increased flow)  Feeding Mother's Current Feeding Choice: Breast Milk and Donor Milk  LATCH Score Latch: Repeated attempts needed to sustain latch, nipple held in mouth throughout feeding, stimulation needed to elicit sucking reflex. (switched to the football position)  Audible Swallowing: A few with stimulation (increased flow)  Type of Nipple: Everted at rest and after stimulation (areola compresses well)  Comfort (Breast/Nipple): Soft / non-tender  Hold (Positioning): Assistance needed to correctly position infant at breast and maintain latch.  LATCH Score: 7   Lactation Tools Discussed/Used Tools: Shells;Pump;Flanges Flange Size: 21 Breast pump type: Manual;Double-Electric Breast Pump Pump Education: Setup, frequency, and cleaning;Milk Storage Reason for Pumping: due to DL Pumping frequency: after attempt or feeding at the breast and supplementing with donor milk Pumped volume: 2 mL  Interventions Interventions: Breast feeding basics reviewed;Assisted with latch;Breast massage;Skin  to skin;Hand express;Reverse pressure;Breast compression;Adjust position;Support pillows;Position options;Shells;Hand pump;DEBP;Education;LC Services brochure;CDC milk storage guidelines;CDC Guidelines for Breast Pump Cleaning  Discharge Pump: Personal;Hands Free;Manual  Consult Status Consult Status: Follow-up Date: 05/03/23 Follow-up type: In-patient    Matilde Sprang Paw Karstens 05/03/2023, 2:52 PM

## 2023-05-03 NOTE — Lactation Note (Signed)
 This note was copied from a baby's chart. Lactation Consultation Note  Patient Name: Tammy Barnett [id number]'[id number] Date: 05/03/2023 Age:23 hours Reason for consult: Follow-up assessment;Primapara;1st time breastfeeding;Infant weight loss (2 % weight loss, MBU nurse assessing the baby and mom is taking a shower. Dad present and LC encouraged and recommended to call with cues or when mom  is done in the shower.) Per RN, baby recently had drops of colostrum.  LC will F/U  Discharge    Consult Status Consult Status: Follow-up Date: 05/03/23 Follow-up type: In-patient    Tammy Barnett 05/03/2023, 10:13 AM

## 2023-05-03 NOTE — Progress Notes (Signed)
 Post Partum Day 1 Subjective:  Tammy Barnett is a 23 y.o. G1P1001 [redacted]w[redacted]d s/p spontaneous vaginal delivery complicated by shoulder dystocia.  No acute events overnight.  Pt denies problems with ambulating, voiding or po intake.  She denies nausea or vomiting.  Pain is well controlled.  She has had flatus.  Lochia Minimal.  Plan for birth control is IUD placed outpatient.  Method of Feeding: Breastfeeding  Objective: Blood pressure 119/73, pulse 87, temperature 98 F (36.7 C), temperature source Oral, resp. rate 16, height 5\' 5"  (1.651 m), weight 110.8 kg, last menstrual period 08/31/2022, SpO2 100%, unknown if currently breastfeeding.  Physical Exam:  General: alert, cooperative and no distress Lochia:normal flow Chest: normal WOB Heart: Regular rate Abdomen: +BS, soft, mild TTP (appropriate) Uterine Fundus: firm, below umbilicus  DVT Evaluation: No evidence of DVT seen on physical exam. Extremities: No peripheral edema  Recent Labs    05/01/23 1208 05/03/23 0551  HGB 8.9* 8.8*  HCT 28.1* 27.6*    Assessment/Plan:  ASSESSMENT: Tammy Barnett is a 22 y.o. G1P1001 [redacted]w[redacted]d s/p SVD on 05/02/23 complicated by shoulder dystocia. She is feeling well and her pain is well controlled with ibuprofen. Her bleeding has improved and is no heavier than an average period for her. She has been able to pass flatus and go to the bathroom. She will be getting IUD placed on an outpatient basis. Her baby is trying to latch with breastfeeding but feeding has been completed with manual expression. Overall the patient is feeling better and will look forward to heading home tomorrow pending pediatric team's evaluation of the baby.  Plan for discharge tomorrow and Breastfeeding Continue routine PP care Breastfeeding support PRN  LOS: 2 days   Elwin Mocha 05/03/2023, 6:25 AM

## 2023-05-04 ENCOUNTER — Other Ambulatory Visit (HOSPITAL_COMMUNITY): Payer: Self-pay

## 2023-05-04 ENCOUNTER — Other Ambulatory Visit

## 2023-05-04 MED ORDER — IBUPROFEN 600 MG PO TABS
600.0000 mg | ORAL_TABLET | Freq: Four times a day (QID) | ORAL | 0 refills | Status: AC | PRN
  Filled 2023-05-04: qty 30, 8d supply, fill #0

## 2023-05-04 MED ORDER — FUROSEMIDE 20 MG PO TABS
20.0000 mg | ORAL_TABLET | Freq: Every day | ORAL | 0 refills | Status: DC
  Filled 2023-05-04: qty 4, 4d supply, fill #0

## 2023-05-04 MED ORDER — POTASSIUM CHLORIDE CRYS ER 20 MEQ PO TBCR
20.0000 meq | EXTENDED_RELEASE_TABLET | Freq: Every day | ORAL | 0 refills | Status: DC
  Filled 2023-05-04: qty 4, 4d supply, fill #0

## 2023-05-04 MED ORDER — FERROUS FUMARATE 324 (106 FE) MG PO TABS
1.0000 | ORAL_TABLET | ORAL | 3 refills | Status: DC
Start: 2023-05-04 — End: 2023-09-18
  Filled 2023-05-04: qty 30, 60d supply, fill #0

## 2023-05-04 MED ORDER — FUROSEMIDE 20 MG PO TABS
20.0000 mg | ORAL_TABLET | Freq: Every day | ORAL | Status: DC
  Administered 2023-05-04: 20 mg via ORAL
  Filled 2023-05-04: qty 1

## 2023-05-04 MED ORDER — FERROUS FUMARATE 324 (106 FE) MG PO TABS
1.0000 | ORAL_TABLET | ORAL | Status: DC
  Administered 2023-05-04: 106 mg via ORAL
  Filled 2023-05-04: qty 1

## 2023-05-04 MED ORDER — POTASSIUM CHLORIDE CRYS ER 20 MEQ PO TBCR
20.0000 meq | EXTENDED_RELEASE_TABLET | Freq: Every day | ORAL | Status: DC
  Administered 2023-05-04: 20 meq via ORAL
  Filled 2023-05-04: qty 1

## 2023-05-04 NOTE — Lactation Note (Signed)
 This note was copied from a baby's chart. Lactation Consultation Note  Patient Name: Tammy Barnett [id number] Date: 05/04/2023 Age:23 hours  Reason for consult: Follow-up assessment;Early term 37-38.6wks;Primapara;1st time breastfeeding;Infant weight loss  P1, [redacted]w[redacted]d, 6.6% weight loss  Follow up LC visit. Mother reports that baby had a very good feeding this morning but she has been sleeping in the last few hours. Infant appears jaundice and serum bilirubin results are pending. Mother has been supplementing with donor breast milk and baby took 20 ml in the last hour. Discussed pumping to stimulate her milk production in addition to latching baby to breast due to gestational age and jaundice. Parents verbalized understanding and will pump now.  Mother encouraged to pump every 3 hours for 15 min in the in the initiation phase.   Mother will call for baby's next feeding when she is alert and showing feeding cues.   Feeding Mother's Current Feeding Choice: Breast Milk and Formula  LATCH Score  Not observed, mother to call for next feeding     Lactation Tools Discussed/Used DEBP    Interventions Interventions: Breast feeding basics reviewed;Education  Discharge Pump: Hands Free;Manual;DEBP  Consult Status Consult Status: Follow-up (mother to call for feeding assist) Follow-up type: In-patient    Christella Hartigan M 05/04/2023, 1:13 PM

## 2023-05-04 NOTE — Progress Notes (Signed)
 CSW received a notification from support staff about [name] being tearful. CSW met [name] at bedside to offer support. CSW entered the room, introduced herself and explained the reason for the visit. CSW observed [name] bonding/holding the infant has FOB showered.  CSW asked [name] about her tearfulness and if any additional support was needed. [name] reported her tearfulness was due to nursing staff concerns and feeling like they have not been "attentive enough". CSW empathized with [name] about her feelings and concerns regarding nursing staff.  CSW assessed [name] to insure at the moment all her needs were met and if any additional needs arise, to reach out to the CSW to insure the remainder if her stay was comfortable and stress free. CSW provided [name] with the information to patient experience with any additional concerns; [name] was understanding.  [name] reported her supports as FOB and her family; and very excited about stepping into motherhood.  CSW provided [name] with therapy resources for the [place] for additional support.  CSW identifies no further need for intervention and no barriers to discharge at this time.  Enos Fling, Theresia Majors Clinical Social Worker [phone]

## 2023-05-04 NOTE — Progress Notes (Addendum)
 Post Partum Day 2 Subjective:  Tammy Barnett is a 23 y.o. G1P1001 [redacted]w[redacted]d s/p  SVD on 05/02/23.  No acute events overnight.  Pt denies problems with ambulating, voiding or po intake.  She denies nausea or vomiting.  Pain is well controlled.  She has had flatus.  Lochia Minimal.  Plan for birth control is IUD.  Method of Feeding: Breastfeeding.  Objective: Blood pressure 123/70, pulse 60, temperature 98.2 F (36.8 C), temperature source Oral, resp. rate 16, height 5\' 5"  (1.651 m), weight 110.8 kg, last menstrual period 08/31/2022, SpO2 96%, unknown if currently breastfeeding.  Physical Exam:  General: alert, cooperative and no distress Lochia:normal flow Chest: normal WOB Heart: Regular rate Abdomen: +BS, soft, mild TTP (appropriate) Uterine Fundus: not examined due to active breastfeeding but recently firm and below the umbilicus  DVT Evaluation: No evidence of DVT seen on physical exam. Extremities: +1 lower extremity edema which patient reports is improved from when she was pregnant.  Recent Labs    05/01/23 1208 05/03/23 0551  HGB 8.9* 8.8*  HCT 28.1* 27.6*    Assessment/Plan:  ASSESSMENT: Tammy Barnett is a 23 y.o. G1P1001 [redacted]w[redacted]d s/p SVD on 05/02/23 complicated by shoulder dystocia. She is feeling well and her pain is well controlled with ibuprofen. Her bleeding has improved; she describes it as very light and decreasing in frequency. She has been able to pass flatus and go to the bathroom. She will be getting IUD placed on an outpatient basis. Her baby successfully latched this morning. Overall the patient is feeling better and will look forward to heading home today. Discharge home Continue routine PP care Breastfeeding support PRN  LOS: 3 days   Elwin Mocha Medical Student 05/04/2023, 6:03 AM

## 2023-05-04 NOTE — Progress Notes (Signed)
 [name] was referred for history of depression/anxiety.  * Referral screened out by Clinical Social Worker because none of the following criteria appear to apply:  ~ History of anxiety/depression during this pregnancy, or of post-partum depression following prior delivery.  ~ Diagnosis of anxiety and/or depression within last 3 years  Per OB notes, [name] did not indicate any signs/symptoms during her pregnancy  OR  * [name]'s symptoms currently being treated with medication and/or therapy.   Please contact the Clinical Social Worker if needs arise, by [facility] request, or if [name] scores greater than 9/yes to question 10 on Edinburgh Postpartum Depression Screen.  Enos Fling, Theresia Majors Clinical Social Worker [phone]

## 2023-05-04 NOTE — Progress Notes (Signed)
 Pt has baby scripts at home and has already  been using it. Pt stated that the office has already called her about elevated levels so she knows it works.

## 2023-05-04 NOTE — Progress Notes (Signed)
CSW acknowledged consult and completed a clinical assessment.  There are no barriers to d/c.  Clinical assessment notes will be entered at a later time.   Enos Fling, Theresia Majors Clinical Social Worker [phone]

## 2023-05-04 NOTE — Patient Instructions (Signed)
 Your appointment with Outpatient Lactation is: May 18, 2023 2:00 PM [facility] (First Floor) [address] [place]  Check in under baby's name.  Please bring your baby hungry along with your pump and a bottle of either formula or expressed breast milk. Please also bring your pump flanges and we welcome support people! If you need lactation assistance before your appointment, please call [phone] and press 4 for lactation.

## 2023-05-08 ENCOUNTER — Ambulatory Visit (HOSPITAL_COMMUNITY): Payer: Self-pay

## 2023-05-08 ENCOUNTER — Encounter: Admitting: Obstetrics & Gynecology

## 2023-05-08 ENCOUNTER — Other Ambulatory Visit

## 2023-05-08 NOTE — Lactation Note (Addendum)
 This note was copied from a baby's chart. Lactation Consultation Note  Patient Name: Tammy Barnett [name] Date: 05/08/2023 Age:23 days Reason for consult: Follow-up assessment;1st time breastfeeding;Early term 37-38.6wks;Hyperbilirubinemia  P1, Baby readmitted for Hyperbilirubinemia and currently under phototherapy. Mother states she has been pumping 90 ml but struggles with baby sustaining latch.  She states baby latches then becomes frustrated and unlatches.  Baby recently consumed 30 ml of breastmilk. Mother willing to attempt at the breast.  With mother's permission, assisted mother with latching in cross cradle hold.  Had mother hand express flow before latching. Baby latched on with ease and sustained latch for 5 min, then unlatched.  Had mother support breast in "U" position in cross cradle hold.  Baby repeated off and on pattern then fell asleep, sustaining at most, 5 min feeding.  Reviewed paced feeding to reduce nipple confusion.   Mother is pumping with 21 mm flange on the left and 24 mm flange of the right side. Mother states the 24 mm flange of the right is hurting. Observed pumping with 21 mm, 24 mm and 27 mm flanges and mother felt at this time 21 mm flange is comfortable. Suggest lubricating tunnel of flange with coconut oil which was provided.  Discussed with mother, periodically checking flange size for comfort.   Mother pumping 90 ml q 3 hours.  Encouraged mother to increase volume with cues and consider outpatient lactation appointment to work with baby on latching back at the breast.  Maternal Data Has patient been taught Hand Expression?: Yes Does the patient have breastfeeding experience prior to this delivery?: No  Feeding Mother's Current Feeding Choice: Breast Milk  LATCH Score Latch: Repeated attempts needed to sustain latch, nipple held in mouth throughout feeding, stimulation needed to elicit sucking reflex.  Audible Swallowing: A few with  stimulation  Type of Nipple: Everted at rest and after stimulation  Comfort (Breast/Nipple): Soft / non-tender  Hold (Positioning): Assistance needed to correctly position infant at breast and maintain latch.  LATCH Score: 7   Lactation Tools Discussed/Used Tools: Pump;Flanges Flange Size: 21 Breast pump type: Double-Electric Breast Pump Pumping frequency: q 3 hours for 15-20 min Pumped volume: 90 mL  Interventions Interventions: Breast feeding basics reviewed;Assisted with latch;Hand express;Breast compression;Adjust position;Support pillows;Education;DEBP  Discharge Pump: Personal;Hands Free;Manual  Consult Status Consult Status: Complete Date: 05/08/23  Hardie Pulley  RN, IBCLC 05/08/2023, 3:22 PM

## 2023-05-09 ENCOUNTER — Encounter

## 2023-05-11 ENCOUNTER — Ambulatory Visit (INDEPENDENT_AMBULATORY_CARE_PROVIDER_SITE_OTHER): Admitting: Advanced Practice Midwife

## 2023-05-11 ENCOUNTER — Encounter: Payer: Self-pay | Admitting: Advanced Practice Midwife

## 2023-05-11 VITALS — BP 128/75 | HR 95 | Wt 210.0 lb

## 2023-05-11 DIAGNOSIS — R300 Dysuria: Secondary | ICD-10-CM

## 2023-05-11 DIAGNOSIS — Z5189 Encounter for other specified aftercare: Secondary | ICD-10-CM

## 2023-05-11 LAB — POCT URINALYSIS DIPSTICK OB
Glucose, UA: NEGATIVE
Ketones, UA: NEGATIVE
Leukocytes, UA: NEGATIVE
Nitrite, UA: NEGATIVE
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
## 2023-05-12 ENCOUNTER — Encounter

## 2023-06-05 DIAGNOSIS — D492 Neoplasm of unspecified behavior of bone, soft tissue, and skin: Secondary | ICD-10-CM | POA: Diagnosis not present

## 2023-06-05 DIAGNOSIS — M216X2 Other acquired deformities of left foot: Secondary | ICD-10-CM | POA: Diagnosis not present

## 2023-06-12 ENCOUNTER — Encounter: Payer: Self-pay | Admitting: Family Medicine

## 2023-06-12 ENCOUNTER — Ambulatory Visit (INDEPENDENT_AMBULATORY_CARE_PROVIDER_SITE_OTHER): Admitting: Family Medicine

## 2023-06-12 VITALS — Temp 97.2°F | Ht 65.0 in

## 2023-06-12 DIAGNOSIS — J301 Allergic rhinitis due to pollen: Secondary | ICD-10-CM | POA: Diagnosis not present

## 2023-06-12 MED ORDER — VALACYCLOVIR HCL 1 G PO TABS: ORAL_TABLET | ORAL | 5 refills | Status: DC

## 2023-06-12 NOTE — Progress Notes (Signed)
   Subjective:    Patient ID: Mack Sayer, female    DOB: [birth year], 23 y.o.   MRN: [id number]  HPI Dry throat, raspy voice and nasal congestion x's 2 days.  Concern for allergies  newborn at home Patient with scratchy throat some allergy symptoms denies high fever chills body aches pain Review of Systems     Objective:   Physical Exam  Gen-NAD not toxic TMS-normal bilateral T- normal no redness Chest-CTA respiratory rate normal no crackles CV RRR no murmur Skin-warm dry Neuro-grossly normal Exam normal      Assessment & Plan:   More than likely related to allergies Claritin is safe with breast-feeding I did recommend that she get a home COVID test to check for this if positive let us  know otherwise should be fine to be around her child no sign of strep pneumonia or sinus infection It is reasonable to avoid kissing her baby on the face for the next several days

## 2023-06-13 ENCOUNTER — Other Ambulatory Visit

## 2023-06-13 ENCOUNTER — Encounter: Payer: Self-pay | Admitting: Obstetrics and Gynecology

## 2023-06-13 ENCOUNTER — Ambulatory Visit: Admitting: Obstetrics and Gynecology

## 2023-06-13 DIAGNOSIS — Z8759 Personal history of other complications of pregnancy, childbirth and the puerperium: Secondary | ICD-10-CM | POA: Diagnosis not present

## 2023-06-13 DIAGNOSIS — Z30430 Encounter for insertion of intrauterine contraceptive device: Secondary | ICD-10-CM | POA: Insufficient documentation

## 2023-06-13 DIAGNOSIS — Z3202 Encounter for pregnancy test, result negative: Secondary | ICD-10-CM

## 2023-06-13 LAB — POCT URINE PREGNANCY: Preg Test, Ur: NEGATIVE

## 2023-06-13 MED ORDER — LEVONORGESTREL 20 MCG/DAY IU IUD
1.0000 | INTRAUTERINE_SYSTEM | Freq: Once | INTRAUTERINE | Status: AC
  Administered 2023-06-13: 1 via INTRAUTERINE

## 2023-06-13 NOTE — Patient Instructions (Addendum)
 IUD PLACEMENT POST-PROCEDURE INSTRUCTIONS   You may take Ibuprofen , Aleve  or Tylenol  for pain if needed.  Cramping should resolve within in 24 hours.   You may have a small amount of spotting.  You should wear a mini pad for the next few days.   3. Use a back up method or abstain first 5-7 days    You need to call if you have any pelvic pain, fever, heavy bleeding or foul smelling vaginal discharge.  Irregular bleeding is common the first several months after having an IUD placed. You do not need to call for this reason unless you are concerned.   Shower or bathe as normal   You should have a follow-up appointment in 4-8 weeks for a re-check to make sure you are not having any problems.

## 2023-06-13 NOTE — Progress Notes (Signed)
 Post Partum Visit Note  Tammy Barnett is a 23 y.o. G45P1001 female who presents for a postpartum visit. She is 6 weeks postpartum following a normal spontaneous vaginal delivery.  I have fully reviewed the prenatal and intrapartum course. The delivery was at 37.1 gestational weeks.  Anesthesia: epidural. Postpartum course has been good. Baby is doing well. Baby is feeding by  breast and formula . Bleeding . Bowel function is normal. Bladder function is normal. Patient is not sexually active. Contraception method is IUD. Postpartum depression screening: negative.   The pregnancy intention screening data noted above was reviewed. Potential methods of contraception were discussed. The patient elected to proceed with IUD   Edinburgh Postnatal Depression Scale - 06/13/23 0952       Edinburgh Postnatal Depression Scale:  In the Past 7 Days   I have been able to laugh and see the funny side of things. 0    I have looked forward with enjoyment to things. 0    I have blamed myself unnecessarily when things went wrong. 0    I have been anxious or worried for no good reason. 0    I have felt scared or panicky for no good reason. 0    Things have been getting on top of me. 0    I have been so unhappy that I have had difficulty sleeping. 0    I have felt sad or miserable. 0    I have been so unhappy that I have been crying. 0    The thought of harming myself has occurred to me. 0    Edinburgh Postnatal Depression Scale Total 0             Health Maintenance Due  Topic Date Due   HPV VACCINES (1 - Risk 3-dose series) Never done   Meningococcal B Vaccine (1 of 2 - Standard) Never done    The following portions of the patient's history were reviewed and updated as appropriate: allergies, current medications, past family history, past medical history, past social history, past surgical history, and problem list.  Review of Systems Pertinent items are noted in HPI.  Objective:  BP  133/78 (BP Location: Left Arm, Patient Position: Sitting, Cuff Size: Normal)   Pulse 95   Ht 5\' 5"  (1.651 m)   Wt 206 lb (93.4 kg)   LMP 08/31/2022   Breastfeeding Yes   BMI 34.28 kg/m    General:  alert and cooperative   Breasts:  not indicated  Lungs: Normal effort  Abdomen: soft    Wound N/a  GU exam:  normal        IUD Insertion Procedure Note Patient identified, informed consent performed, consent signed.   Discussed risks of irregular bleeding, cramping, infection, malpositioning or misplacement of the IUD outside the uterus which may require further procedure such as laparoscopy. Time out was performed.  Urine pregnancy test negative.  Speculum placed in the vagina.  Cervix visualized.  Cleaned with Betadine x 2.  Grasped anteriorly with a single tooth tenaculum.  Uterus sounded to 7 cm.  Mirena IUD placed per manufacturer's recommendations.  Strings trimmed to 3 cm. Tenaculum was removed, good hemostasis noted.  Patient tolerated procedure well.   Patient was given post-procedure instructions.  She was advised to have backup contraception for one week.  Patient was also asked to check IUD strings periodically and follow up in 4 weeks for IUD check.   Assessment:   1. Encounter for  postpartum visit (Primary)   2. Gestational hypertension, antepartum Normotensive, not on meds  3. Encounter for IUD insertion See insertion above   Plan:   Essential components of care per ACOG recommendations:  1.  Mood and well being: Patient with negative depression screening today. Reviewed local resources for support.  - Patient tobacco use? No.   - hx of drug use? No.    2. Infant care and feeding:  -Patient currently breastmilk feeding? yes - works for herself, pumping and formula  -Social determinants of health (SDOH) reviewed in EPIC. No concerns  3. Sexuality, contraception and birth spacing - Patient does not want a pregnancy in the next year.  Desired family size is  unsure children.  - Reviewed reproductive life planning. Reviewed contraceptive methods based on pt preferences and effectiveness.  Patient desired IUD or IUS today.   - Discussed birth spacing of 18 months  4. Sleep and fatigue -Encouraged family/partner/community support of 4 hrs of uninterrupted sleep to help with mood and fatigue  5. Physical Recovery  - Discussed patients delivery and complications. She describes her labor as good. - Patient had a  vaginal delivery .shoulder dystocia, chorioamnionitis Patient had a 2nd degree laceration. Perineal healing reviewed. Patient expressed understanding - Patient has urinary incontinence? No. - Patient is safe to resume physical and sexual activity  6.  Health Maintenance - HM due items addressed Yes - Last pap smear  Diagnosis  Date Value Ref Range Status  09/01/2022   Final   - Negative for intraepithelial lesion or malignancy (NILM)   Pap smear not done at today's visit.  -Breast Cancer screening indicated? No.   7. Chronic Disease/Pregnancy Condition follow up: None  - PCP follow up  Return in 4 weeks for IUD check   Susi Eric, FNP [facility] [employer], [facility]

## 2023-06-19 ENCOUNTER — Encounter: Payer: Self-pay | Admitting: Advanced Practice Midwife

## 2023-06-19 DIAGNOSIS — M216X2 Other acquired deformities of left foot: Secondary | ICD-10-CM | POA: Diagnosis not present

## 2023-06-19 DIAGNOSIS — D492 Neoplasm of unspecified behavior of bone, soft tissue, and skin: Secondary | ICD-10-CM | POA: Diagnosis not present

## 2023-07-06 DIAGNOSIS — D2372 Other benign neoplasm of skin of left lower limb, including hip: Secondary | ICD-10-CM | POA: Diagnosis not present

## 2023-07-06 DIAGNOSIS — M216X2 Other acquired deformities of left foot: Secondary | ICD-10-CM | POA: Diagnosis not present

## 2023-08-03 DIAGNOSIS — B070 Plantar wart: Secondary | ICD-10-CM | POA: Diagnosis not present

## 2023-08-24 ENCOUNTER — Telehealth: Payer: Self-pay | Admitting: Nurse Practitioner
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
## 2023-08-25 ENCOUNTER — Encounter: Payer: Self-pay | Admitting: Nurse Practitioner

## 2023-08-25 NOTE — Telephone Encounter (Signed)
 Letter and my chart message sent

## 2023-08-25 NOTE — Telephone Encounter (Signed)
 Message sent to clarify what is needed.

## 2023-08-25 NOTE — Telephone Encounter (Signed)
 Tammy Barnett is handling this particular issue

## 2023-09-10 ENCOUNTER — Other Ambulatory Visit: Payer: Self-pay | Admitting: Family Medicine

## 2023-09-11 ENCOUNTER — Other Ambulatory Visit: Payer: Self-pay

## 2023-09-11 MED ORDER — VALACYCLOVIR HCL 1 G PO TABS: ORAL_TABLET | ORAL | 5 refills | Status: AC

## 2023-09-18 ENCOUNTER — Encounter: Payer: Self-pay | Admitting: Adult Health

## 2023-09-18 ENCOUNTER — Ambulatory Visit: Admitting: Adult Health

## 2023-09-18 VITALS — BP 125/79 | HR 91 | Ht 65.0 in | Wt 222.0 lb

## 2023-09-18 DIAGNOSIS — Z1331 Encounter for screening for depression: Secondary | ICD-10-CM

## 2023-09-18 DIAGNOSIS — F419 Anxiety disorder, unspecified: Secondary | ICD-10-CM | POA: Diagnosis not present

## 2023-09-18 MED ORDER — BUSPIRONE HCL 5 MG PO TABS: 5.0000 mg | ORAL_TABLET | Freq: Three times a day (TID) | ORAL | 1 refills | Status: AC

## 2023-09-18 NOTE — Progress Notes (Signed)
 Subjective:     Patient ID: Tammy Barnett, female   DOB: Apr 14, 2000, 23 y.o.   MRN: [id number]  HPI Tammy Barnett is a 23 year old white female,single, G1P1001, in complaining of emotions every where, mostly anxious but feels down occasionally.  Feels like can't sit still. Does have help with the baby. She says she took lexparo, years ago and it made her mean.     Component Value Date/Time   DIAGPAP  09/01/2022 0835    - Negative for intraepithelial lesion or malignancy (NILM)   DIAGPAP (A) 06/21/2021 1132    - Atypical squamous cells of undetermined significance (ASC-US )   HPVHIGH Positive (A) 06/21/2021 1132   ADEQPAP  09/01/2022 0835    Satisfactory for evaluation; transformation zone component PRESENT.   ADEQPAP  06/21/2021 1132    Satisfactory for evaluation; transformation zone component PRESENT.    PCP is Dr Alphonsa  Review of Systems +emotions every where, mostly anxious but feels down occasionally.  Feels like can't sit still. Does have help with the baby.   Reviewed past medical,surgical, social and family history. Reviewed medications and allergies.  Objective:   Physical Exam BP 125/79 (BP Location: Right Arm, Patient Position: Sitting, Cuff Size: Large)   Pulse 91   Ht 5' 5 (1.651 m)   Wt 222 lb (100.7 kg)   Breastfeeding No   BMI 36.94 kg/m     Skin warm and dry. Lungs: clear to ausculation bilaterally. Cardiovascular: regular rate and rhythm.  Fall risk is low    09/18/2023   12:03 PM 06/12/2023   11:23 AM 04/24/2023    3:09 PM  Depression screen PHQ 2/9  Decreased Interest 0 0 0  Down, Depressed, Hopeless 1 0 0  PHQ - 2 Score 1 0 0  Altered sleeping 1 0 0  Tired, decreased energy 1 0 0  Change in appetite 1 0 0  Feeling bad or failure about yourself  0 0 0  Trouble concentrating 0 0 0  Moving slowly or fidgety/restless 0 0 0  Suicidal thoughts 0 0 0  PHQ-9 Score 4 0 0  Difficult doing work/chores  Not difficult at all        09/18/2023   12:04 PM  06/12/2023   11:25 AM 04/24/2023    3:09 PM 12/29/2022    8:45 AM  GAD 7 : Generalized Anxiety Score  Nervous, Anxious, on Edge 2 0 0 0  Control/stop worrying 2 0 0 0  Worry too much - different things 1 0 0 0  Trouble relaxing 1 0 0 0  Restless 0 0 0 0  Easily annoyed or irritable 1 0 0 0  Afraid - awful might happen 2 0 0 0  Total GAD 7 Score 9 0 0 0  Anxiety Difficulty  Not difficult at all        Upstream - 09/18/23 1158       Pregnancy Intention Screening   Does the patient want to become pregnant in the next year? No    Does the patient's partner want to become pregnant in the next year? No    Would the patient like to discuss contraceptive options today? No      Contraception Wrap Up   Current Method IUD or IUS    End Method IUD or IUS    Contraception Counseling Provided Yes          Assessment:     1. Anxiety (Primary) Will rx buspar  Meds ordered  this encounter  Medications   busPIRone  (BUSPAR ) 5 MG tablet    Sig: Take 1 tablet (5 mg total) by mouth 3 (three) times daily.    Dispense:  90 tablet    Refill:  1    Supervising Provider:   JAYNE VONN DEL [[id number]]        Plan:     Follow up in 6 weeks for ROS

## 2023-10-19 ENCOUNTER — Encounter: Payer: Self-pay | Admitting: Family Medicine

## 2023-10-30 ENCOUNTER — Ambulatory Visit: Admitting: Adult Health

## 2024-01-07 IMAGING — DX DG CHEST 2V
2 series · 2 of 2 positions shown · non-contrast
Comparison: Chest radiograph 01/18/2021.

CLINICAL DATA: Cough.

EXAM:
CHEST - 2 VIEW

[chest pa]
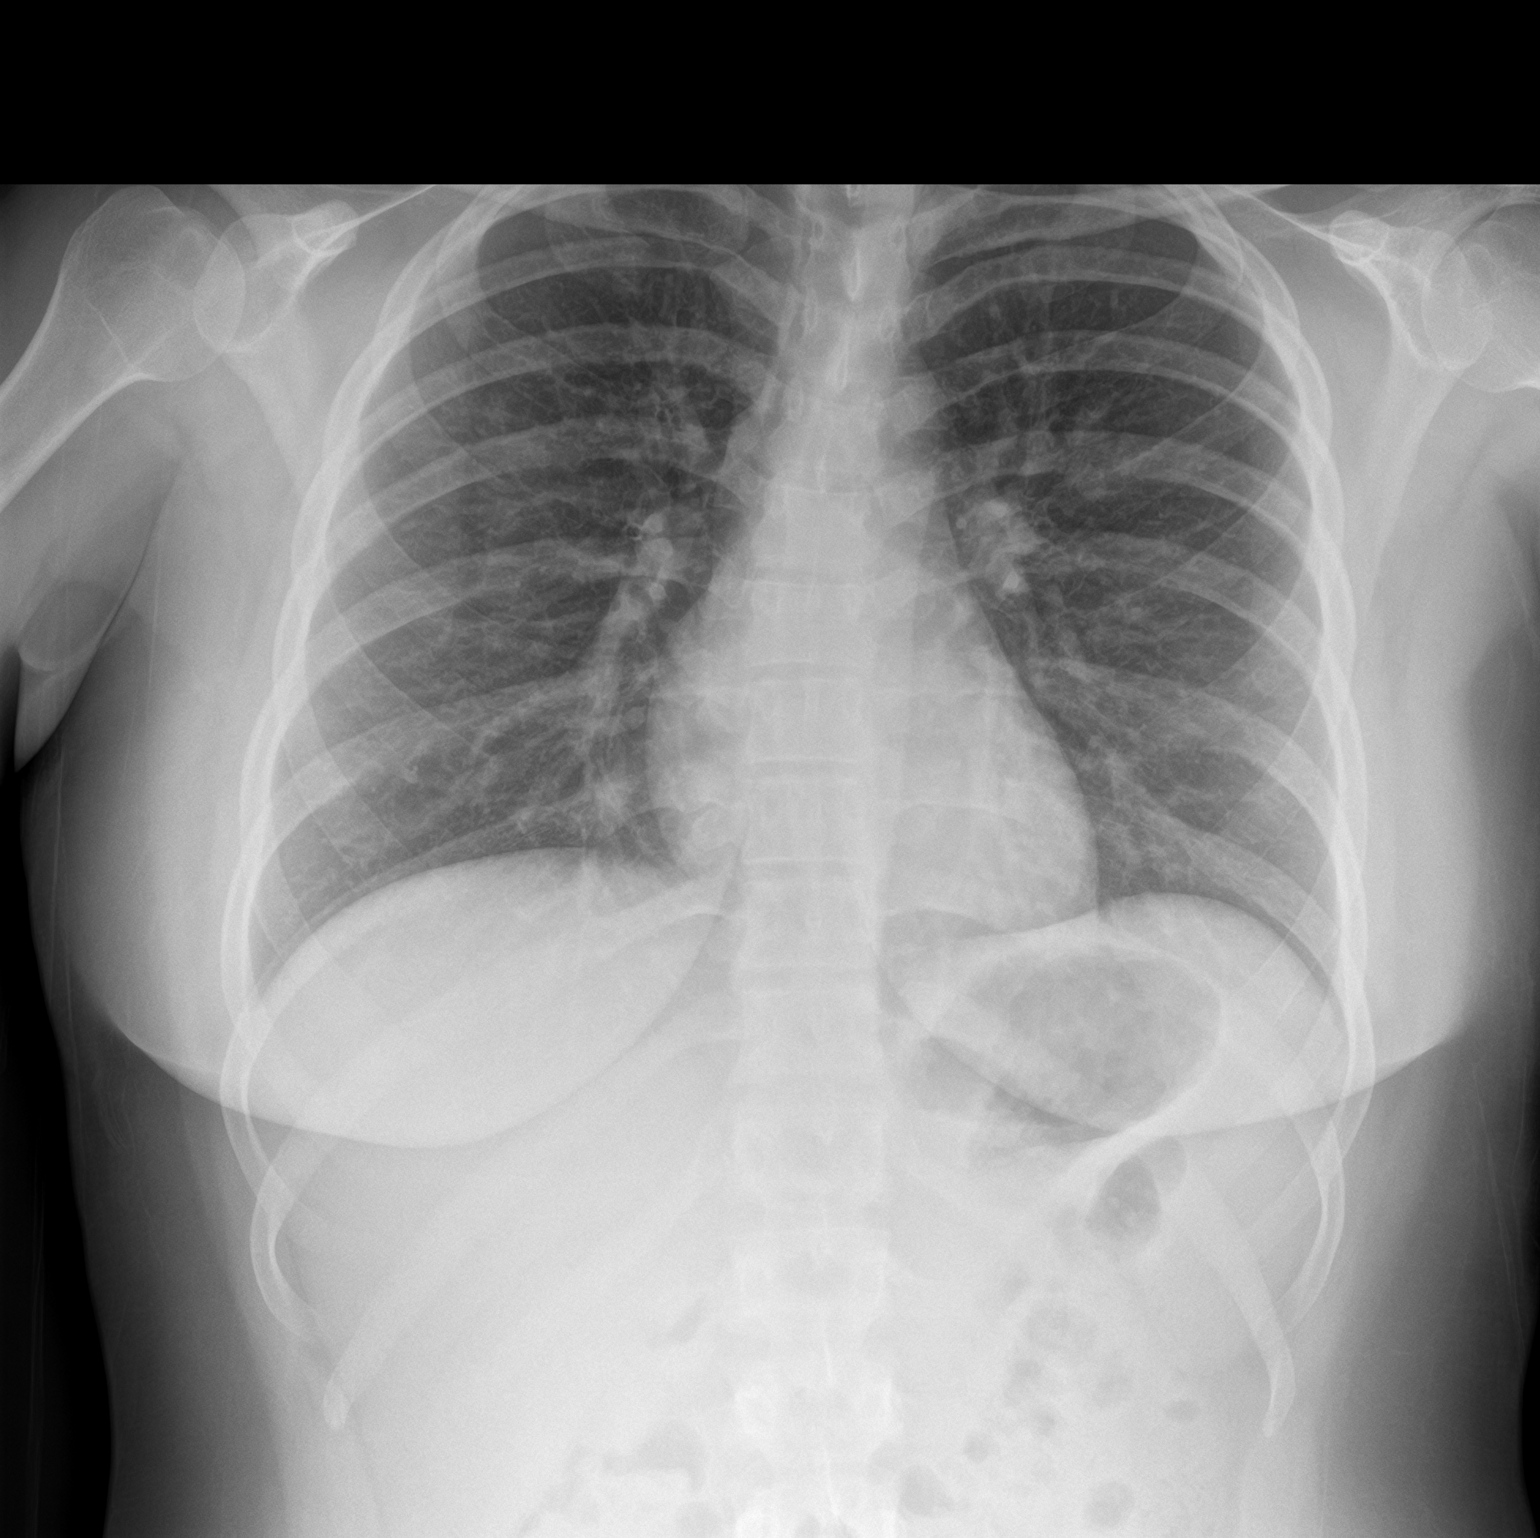

[chest lat]
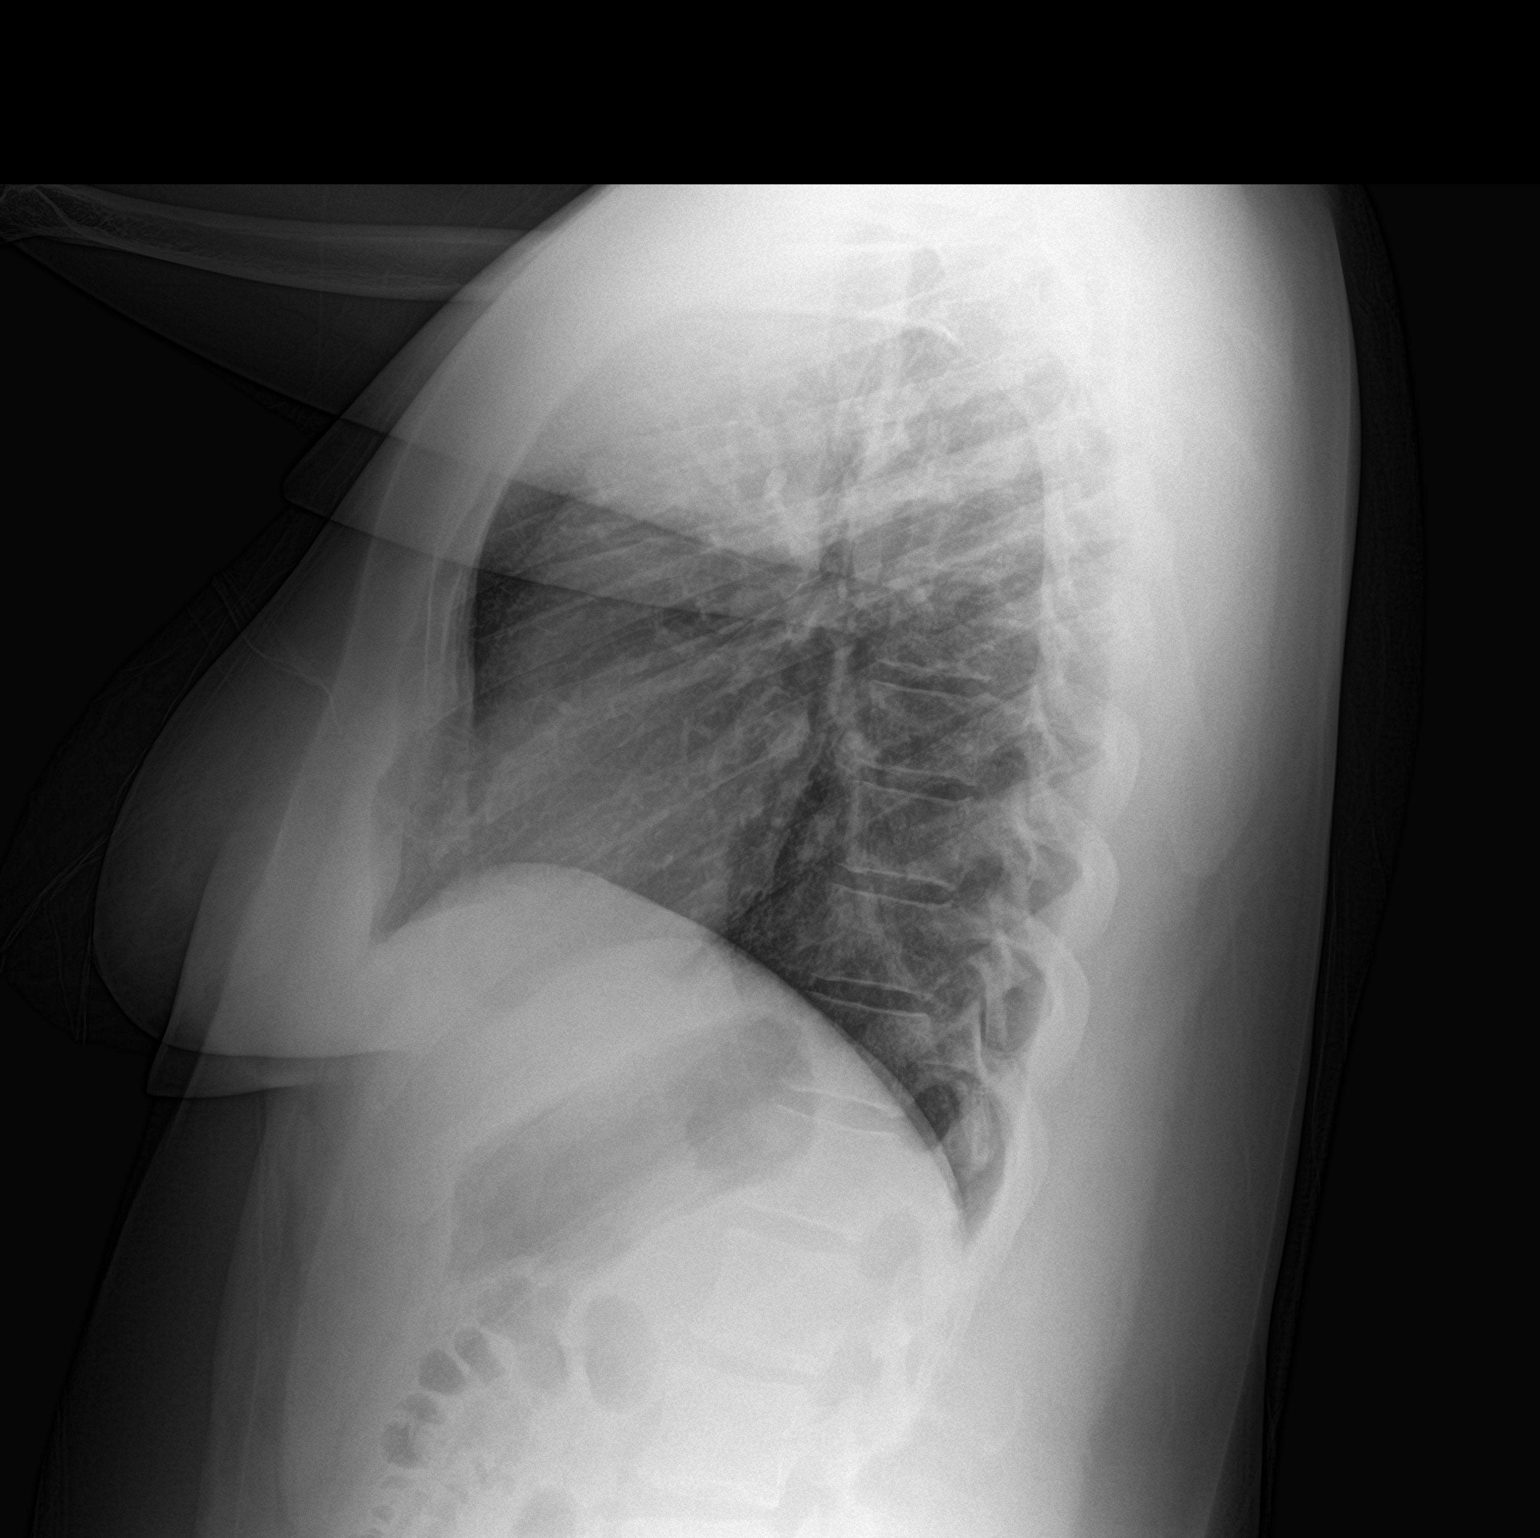

[2 of 2 positions shown; findings below may reference images not displayed]

FINDINGS: No consolidation. No visible pleural effusions or pneumothorax.
Cardiomediastinal silhouette is within normal limits. No acute
osseous abnormality.
IMPRESSION: No evidence of acute cardiopulmonary disease.

## 2024-01-16 ENCOUNTER — Ambulatory Visit
Admission: RE | Admit: 2024-01-16 | Discharge: 2024-01-16 | Disposition: A | Discharge status: Home / Self Care | Attending: Family Medicine

## 2024-01-16 ENCOUNTER — Other Ambulatory Visit: Payer: Self-pay

## 2024-01-16 VITALS — BP 119/79 | HR 84 | Temp 98.3°F | Resp 20

## 2024-01-16 DIAGNOSIS — J0100 Acute maxillary sinusitis, unspecified: Secondary | ICD-10-CM

## 2024-01-16 MED ORDER — AZELASTINE HCL 0.1 % NA SOLN: 1.0000 | Freq: Two times a day (BID) | NASAL | 0 refills | Status: AC

## 2024-01-16 MED ORDER — AMOXICILLIN-POT CLAVULANATE 875-125 MG PO TABS: 1.0000 | ORAL_TABLET | Freq: Two times a day (BID) | ORAL | 0 refills | Status: AC

## 2024-01-16 MED ORDER — PROMETHAZINE-DM 6.25-15 MG/5ML PO SYRP: 5.0000 mL | ORAL_SOLUTION | Freq: Four times a day (QID) | ORAL | 0 refills | Status: AC | PRN

## 2024-01-16 NOTE — ED Triage Notes (Signed)
 Pt reports productive cough x2 weeks.has been tested for covid/flu/strep over last 2 weeks. Intermittent fever, nasal congestion, chills, sore throat since this am.has taken sudafed this am.

## 2024-01-17 ENCOUNTER — Telehealth: Payer: Self-pay | Admitting: Emergency Medicine

## 2024-01-17 NOTE — Telephone Encounter (Signed)
 Pt called, verified using two identifiers, and reported is not feeling better and is unable to go to work. Pt inquiring if note can be extended another day. Discussed with pt that work note can be extended until 01/19/2024 but that if pt does not feel better no other work notes can be offered in the ambulatory setting and pt would need to be re-evaluated. Pt verbalized understanding and asked if work note could be sent via mychart.Work note sent to pt's my chart.

## 2024-01-18 ENCOUNTER — Encounter: Payer: Self-pay | Admitting: Family Medicine

## 2024-01-18 ENCOUNTER — Ambulatory Visit: Admitting: Family Medicine

## 2024-01-18 ENCOUNTER — Ambulatory Visit: Payer: Self-pay

## 2024-01-18 VITALS — BP 108/78 | HR 108 | Temp 99.3°F | Ht 65.0 in | Wt 212.6 lb

## 2024-01-18 DIAGNOSIS — J029 Acute pharyngitis, unspecified: Secondary | ICD-10-CM

## 2024-01-18 DIAGNOSIS — J069 Acute upper respiratory infection, unspecified: Secondary | ICD-10-CM

## 2024-01-18 NOTE — Progress Notes (Signed)
 Acute Office Visit  Patient ID: Tammy Barnett, female    DOB: 2000-12-13, 23 y.o.   MRN: [id number]  PCP: Alphonsa Glendia LABOR, MD  Chief Complaint  Patient presents with   Acute Visit    Body aches, chills, fever, sore throat, nasal congestion. Seen at urgent care 01/16/24 diagnosed with sinus infection and started on antibiotic. Triage RN Day 3 of antibiotics, no improvement, stated no swab tests done.      Subjective:     HPI  Discussed the use of AI scribe software for clinical note transcription with the patient, who gave verbal consent to proceed.  History of Present Illness Tammy Barnett is a 23 year old female who presents with persistent symptoms of a sinus infection despite 48 hours of antibiotic treatment.  Initially seen at urgent care on December 9th with symptoms of sore throat, congestion, and body aches, she described severe body pain, likening it to being 'hit by a Mack truck.' She was told at urgent care that she had a sinus infection and was prescribed Augmentin  (amoxicillin /clavulanate).  Despite 48 hours on the antibiotic, there has been no improvement in symptoms, including persistent sore throat, nasal congestion, and body aches. She has tried home remedies such as a humidifier, saline nasal spray, Vicks Vaporub, and vapor showers without relief.  She has an eight-month-old child at home and has arranged for her grandparents to care for the child to prevent potential transmission of her illness. 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 Her daughter recently started daycare, which may also contribute to exposure to illnesses.  She experiences pressure in her head but no significant ear pain. She has not conducted any home tests for flu or COVID-19. She is concerned about the possibility of having strep throat, as she has experienced it before and notes that her current throat pain is severe.   Review of Systems   All other systems reviewed and are negative.   Past Medical History:  Diagnosis Date   Anxiety    ASCUS with positive high risk HPV cervical 06/28/2021   06/28/21 repeat in 1 year per [facility]    Breast lump 09/10/2015   Depression    Dysmenorrhea 01/08/2014   Encounter for menstrual regulation 01/15/2014   Irregular intermenstrual bleeding 08/27/2014   Right ovarian cyst 01/15/2014   Ruptured ovarian cyst 01/08/2014   Thyroid  nodule 09/10/2015    History reviewed. No pertinent surgical history.  Medications Ordered Prior to Encounter[1]  Allergies[2]     Objective:    BP 108/78   Pulse (!) 108   Temp 99.3 F (37.4 C)   Ht 5' 5 (1.651 m)   Wt 212 lb 9.6 oz (96.4 kg)   LMP 01/14/2024 (Approximate)   SpO2 99%   BMI 35.38 kg/m    Physical Exam Vitals and nursing note reviewed.  Constitutional:      Appearance: Normal appearance. She is normal weight.  HENT:     Head: Normocephalic and atraumatic.     Right Ear: Tympanic membrane, ear canal and external ear normal.     Left Ear: Tympanic membrane, ear canal and external ear normal.     Nose: Congestion present.     Right Sinus: Maxillary sinus tenderness and frontal sinus tenderness present.     Left Sinus: Maxillary sinus tenderness and frontal sinus tenderness present.     Mouth/Throat:     Pharynx: Posterior oropharyngeal erythema present.  Tonsils: Tonsillar exudate present.      Comments: Patchy exudate bilateral Eyes:     Extraocular Movements: Extraocular movements intact.     Conjunctiva/sclera: Conjunctivae normal.  Cardiovascular:     Rate and Rhythm: Normal rate and regular rhythm.     Pulses: Normal pulses.     Heart sounds: Normal heart sounds.  Pulmonary:     Effort: Pulmonary effort is normal.     Breath sounds: Normal breath sounds.  Musculoskeletal:     Cervical back: No tenderness.  Lymphadenopathy:     Cervical: No cervical adenopathy.  Skin:    General: Skin is warm and dry.   Neurological:     General: No focal deficit present.     Mental Status: She is alert and oriented to person, place, and time. Mental status is at baseline.  Psychiatric:        Mood and Affect: Mood normal.        Behavior: Behavior normal.        Thought Content: Thought content normal.        Judgment: Judgment normal.       Results for orders placed or performed in visit on 01/18/24  STREP GROUP A AG, W/REFLEX TO CULT   Specimen: Throat  Result Value Ref Range   Streptococcus Group A AG NOT DETECTED NOT DETECTED       Assessment & Plan:   Problem List Items Addressed This Visit       Respiratory   Viral URI - Primary   Relevant Orders   STREP GROUP A AG, W/REFLEX TO CULT (Completed)    Assessment and Plan Assessment & Plan Acute sinusitis Symptoms suggest sinusitis despite white throat patches.  - Negative strep in office, culture sent. - Continue Augmentin  as prescribed. - Declined viral testing.  - Advised rest and monitor symptoms with symptomatic management. - Provided work excuse until fever-free.    No orders of the defined types were placed in this encounter.   Return if symptoms worsen or fail to improve.  Jeoffrey GORMAN Barrio, FNP [facility]       [1]  Current Outpatient Medications on File Prior to Visit  Medication Sig Dispense Refill   amoxicillin -clavulanate (AUGMENTIN ) 875-125 MG tablet Take 1 tablet by mouth every 12 (twelve) hours. 14 tablet 0   azelastine  (ASTELIN ) 0.1 % nasal spray Place 1 spray into both nostrils 2 (two) times daily. Use in each nostril as directed 30 mL 0   busPIRone  (BUSPAR ) 5 MG tablet Take 1 tablet (5 mg total) by mouth 3 (three) times daily. (Patient taking differently: Take 5 mg by mouth as needed.) 90 tablet 1   ibuprofen  (ADVIL ) 600 MG tablet Take 1 tablet (600 mg total) by mouth every 6 (six) hours as needed. 30 tablet 0   levonorgestrel  (MIRENA ) 20 MCG/DAY IUD 1 each by  Intrauterine route once.     Prenatal Vit-Fe Fumarate-FA (MULTIVITAMIN-PRENATAL) 27-0.8 MG TABS tablet Take 1 tablet by mouth daily at 12 noon.     promethazine -dextromethorphan  (PROMETHAZINE -DM) 6.25-15 MG/5ML syrup Take 5 mLs by mouth 4 (four) times daily as needed. 100 mL 0   valACYclovir  (VALTREX ) 1000 MG tablet Take 2 tablets now and repeat in 12 hours (Patient taking differently: as needed. Take 2 tablets now and repeat in 12 hours) 4 tablet 5   No current facility-administered medications on file prior to visit.  [2] No Known Allergies

## 2024-01-18 NOTE — Telephone Encounter (Signed)
 FYI Only or Action Required?: FYI only for provider: appointment scheduled on 01/18/24.  Patient was last seen in primary care on 06/12/2023 by Alphonsa Glendia LABOR, MD.  Called Nurse Triage reporting Sore Throat and Generalized Body Aches.  Symptoms began several days ago.  Interventions attempted: OTC medications: Dayquil, Nyquil, muccinex and Prescription medications: augmentin .  Symptoms are: gradually worsening.  Triage Disposition: See Physician Within 24 Hours  Patient/caregiver understands and will follow disposition?: Yes           Copied from CRM #[id number]. Topic: Clinical - Red Word Triage >> Jan 18, 2024  8:08 AM Larissa RAMAN wrote: [facility] that prompted transfer to Nurse Triage: sore throat w/ redness- worsening, possible strep throat Reason for Disposition  SEVERE throat pain (e.g., excruciating)  Answer Assessment - Initial Assessment Questions 1. ONSET: When did the throat start hurting? (Hours or days ago)      Tuesday.  2. SEVERITY: How bad is the sore throat? (Scale 1-10; mild, moderate or severe)     Un medicated 10/10, medicated it is 6-7/10.  3. STREP EXPOSURE: Has there been any exposure to strep within the past week? If Yes, ask: What type of contact occurred?      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  4.  VIRAL SYMPTOMS: Are there any symptoms of a cold, such as a runny nose, cough, hoarse voice or red eyes?      Yes.  5. FEVER: Do you have a fever? If Yes, ask: What is your temperature, how was it measured, and when did it start?     On 01/16/24 had fever 100.7  6. PUS ON THE TONSILS: Is there pus on the tonsils in the back of your throat?     Unsure if any white patches or mucous. Redness at back of throat.  7. OTHER SYMPTOMS: Do you have any other symptoms? (e.g., difficulty breathing, headache, rash)     Body aches, chills, nasal congestion.  8. PREGNANCY: Is there any chance you are  pregnant? When was your last menstrual period?     IUD, irregular periods. LMP 2 weeks ago.  Protocols used: Sore Throat-A-AH

## 2024-01-19 NOTE — ED Provider Notes (Signed)
 [facility]    CSN: [id number] Arrival date & time: 01/16/24  1124      History   Chief Complaint Chief Complaint  Patient presents with   Sore Throat    nasal congestion, sore throat, fever - Entered by patient    HPI Tammy Barnett is a 23 y.o. female.   Patient presenting today with 2-week history of progressively worsening productive cough, intermittent fevers, nasal congestion, facial pain and pressure, chills, sore throat.  Denies chest pain, shortness of breath, abdominal pain, vomiting, diarrhea.  Was tested initially for COVID flu and strep and all have been negative.  Taking Sudafed and other cold and congestion medications with minimal relief.    Past Medical History:  Diagnosis Date   Anxiety    ASCUS with positive high risk HPV cervical 06/28/2021   06/28/21 repeat in 1 year per [facility]    Breast lump 09/10/2015   Depression    Dysmenorrhea 01/08/2014   Encounter for menstrual regulation 01/15/2014   Irregular intermenstrual bleeding 08/27/2014   Right ovarian cyst 01/15/2014   Ruptured ovarian cyst 01/08/2014   Thyroid  nodule 09/10/2015    Patient Active Problem List   Diagnosis Date Noted   Encounter for IUD insertion 06/13/2023   History of chorioamnionitis 05/02/2023   History of shoulder dystocia in prior pregnancy 05/02/2023   History of gestational hypertension 05/01/2023   Viral URI 02/14/2023   ASCUS with positive high risk HPV cervical 06/28/2021   Sleep disturbance 06/21/2021   Trauma in childhood 11/20/2020   Anxiety 04/11/2017   Thyroid  nodule 09/10/2015    History reviewed. No pertinent surgical history.  OB History     Gravida  1   Para  1   Term  1   Preterm  0   AB  0   Living  1      SAB  0   IAB  0   Ectopic  0   Multiple  0   Live Births  1            Home Medications    Prior to Admission medications  Medication Sig Start Date End Date Taking? Authorizing Provider   amoxicillin -clavulanate (AUGMENTIN ) 875-125 MG tablet Take 1 tablet by mouth every 12 (twelve) hours. 01/16/24  Yes Stuart Vernell Norris, PA-C  azelastine  (ASTELIN ) 0.1 % nasal spray Place 1 spray into both nostrils 2 (two) times daily. Use in each nostril as directed 01/16/24  Yes Stuart Vernell Norris, PA-C  promethazine -dextromethorphan  (PROMETHAZINE -DM) 6.25-15 MG/5ML syrup Take 5 mLs by mouth 4 (four) times daily as needed. 01/16/24  Yes Stuart Vernell Norris, PA-C  busPIRone  (BUSPAR ) 5 MG tablet Take 1 tablet (5 mg total) by mouth 3 (three) times daily. Patient taking differently: Take 5 mg by mouth as needed. 09/18/23   Signa Delon LABOR, NP  ibuprofen  (ADVIL ) 600 MG tablet Take 1 tablet (600 mg total) by mouth every 6 (six) hours as needed. 05/04/23   Loreli Suzen BIRCH, CNM  levonorgestrel  (MIRENA ) 20 MCG/DAY IUD 1 each by Intrauterine route once.    [provider]  Prenatal Vit-Fe Fumarate-FA (MULTIVITAMIN-PRENATAL) 27-0.8 MG TABS tablet Take 1 tablet by mouth daily at 12 noon.    [provider]  valACYclovir  (VALTREX ) 1000 MG tablet Take 2 tablets now and repeat in 12 hours Patient taking differently: as needed. Take 2 tablets now and repeat in 12 hours 09/11/23   Alphonsa Glendia LABOR, MD    Family History Family History  Problem Relation Age of Onset   Hypertension Paternal Grandfather    Diabetes Paternal Grandmother    Hypertension Father     Social History Social History[1]   Allergies   Patient has no known allergies.   Review of Systems Review of Systems Per HPI  Physical Exam Triage Vital Signs ED Triage Vitals  Encounter Vitals Group     BP 01/16/24 1220 119/79     Girls Systolic BP Percentile --      Girls Diastolic BP Percentile --      Boys Systolic BP Percentile --      Boys Diastolic BP Percentile --      Pulse Rate 01/16/24 1220 84     Resp 01/16/24 1220 20     Temp 01/16/24 1220 98.3 F (36.8 C)     Temp Source 01/16/24 1220 Oral      SpO2 01/16/24 1220 98 %     Weight --      Height --      Head Circumference --      Peak Flow --      Pain Score 01/16/24 1218 8     Pain Loc --      Pain Education --      Exclude from Growth Chart --    No data found.  Updated Vital Signs BP 119/79 (BP Location: Right Arm)   Pulse 84   Temp 98.3 F (36.8 C) (Oral)   Resp 20   LMP 01/14/2024 (Approximate)   SpO2 98%   Breastfeeding No   Visual Acuity Right Eye Distance:   Left Eye Distance:   Bilateral Distance:    Right Eye Near:   Left Eye Near:    Bilateral Near:     Physical Exam Vitals and nursing note reviewed.  Constitutional:      Appearance: Normal appearance.  HENT:     Head: Atraumatic.     Right Ear: Tympanic membrane and external ear normal.     Left Ear: Tympanic membrane and external ear normal.     Nose: Congestion present.     Mouth/Throat:     Mouth: Mucous membranes are moist.     Pharynx: Posterior oropharyngeal erythema present.  Eyes:     Extraocular Movements: Extraocular movements intact.     Conjunctiva/sclera: Conjunctivae normal.  Cardiovascular:     Rate and Rhythm: Normal rate and regular rhythm.     Heart sounds: Normal heart sounds.  Pulmonary:     Effort: Pulmonary effort is normal.     Breath sounds: Normal breath sounds. No wheezing.  Musculoskeletal:        General: Normal range of motion.     Cervical back: Normal range of motion and neck supple.  Skin:    General: Skin is warm and dry.  Neurological:     Mental Status: She is alert and oriented to person, place, and time.  Psychiatric:        Mood and Affect: Mood normal.        Thought Content: Thought content normal.      UC Treatments / Results  Labs (all labs ordered are listed, but only abnormal results are displayed) Labs Reviewed - No data to display  EKG   Radiology No results found.  Procedures Procedures (including critical care time)  Medications Ordered in UC Medications - No data to  display  Initial Impression / Assessment and Plan / UC Course  I have reviewed the triage vital signs  and the nursing notes.  Pertinent labs & imaging results that were available during my care of the patient were reviewed by me and considered in my medical decision making (see chart for details).     Vital signs and exam overall reassuring today, given duration worsening course will treat for sinusitis with Augmentin , Astelin , Phenergan  DM.  Discussed supportive over-the-counter medications and home care.  Return for worsening or unresolving symptoms.  Final Clinical Impressions(s) / UC Diagnoses   Final diagnoses:  Acute non-recurrent maxillary sinusitis   Discharge Instructions   None    ED Prescriptions     Medication Sig Dispense Auth. Provider   amoxicillin -clavulanate (AUGMENTIN ) 875-125 MG tablet Take 1 tablet by mouth every 12 (twelve) hours. 14 tablet Stuart Vernell Norris, PA-C   azelastine  (ASTELIN ) 0.1 % nasal spray Place 1 spray into both nostrils 2 (two) times daily. Use in each nostril as directed 30 mL Stuart Vernell Norris, PA-C   promethazine -dextromethorphan  (PROMETHAZINE -DM) 6.25-15 MG/5ML syrup Take 5 mLs by mouth 4 (four) times daily as needed. 100 mL Stuart Vernell Norris, [place]      PDMP not reviewed this encounter.    [1]  Social History Tobacco Use   Smoking status: Never   Smokeless tobacco: Never  Vaping Use   Vaping status: Former  Substance Use Topics   Alcohol use: Not Currently    Comment: occ   Drug use: No     Stuart Vernell Norris, PA-C 01/19/24 0820

## 2024-01-20 LAB — CULTURE, GROUP A STREP
Micro Number: 17343591
SPECIMEN QUALITY:: ADEQUATE

## 2024-01-20 LAB — STREP GROUP A AG, W/REFLEX TO CULT: Streptococcus Group A AG: NOT DETECTED

## 2024-02-07 ENCOUNTER — Other Ambulatory Visit: Payer: Self-pay
